# Patient Record
Sex: Female | Born: 1935
Health system: Southern US, Community
[De-identification: ages and names within clinical notes are randomized; demographics above are authoritative.]

## PROBLEM LIST (undated history)

## (undated) DIAGNOSIS — I77810 Thoracic aortic ectasia: Secondary | ICD-10-CM

## (undated) DIAGNOSIS — M8580 Other specified disorders of bone density and structure, unspecified site: Secondary | ICD-10-CM

## (undated) DIAGNOSIS — F5104 Psychophysiologic insomnia: Secondary | ICD-10-CM

## (undated) DIAGNOSIS — N390 Urinary tract infection, site not specified: Secondary | ICD-10-CM

## (undated) DIAGNOSIS — E7800 Pure hypercholesterolemia, unspecified: Secondary | ICD-10-CM

## (undated) DIAGNOSIS — K219 Gastro-esophageal reflux disease without esophagitis: Secondary | ICD-10-CM

## (undated) DIAGNOSIS — I700 Atherosclerosis of aorta: Secondary | ICD-10-CM

## (undated) DIAGNOSIS — R413 Other amnesia: Secondary | ICD-10-CM

## (undated) DIAGNOSIS — I771 Stricture of artery: Secondary | ICD-10-CM

## (undated) DIAGNOSIS — G2581 Restless legs syndrome: Secondary | ICD-10-CM

## (undated) DIAGNOSIS — Z6830 Body mass index (BMI) 30.0-30.9, adult: Secondary | ICD-10-CM

## (undated) DIAGNOSIS — H409 Unspecified glaucoma: Secondary | ICD-10-CM

## (undated) DIAGNOSIS — E1140 Type 2 diabetes mellitus with diabetic neuropathy, unspecified: Secondary | ICD-10-CM

## (undated) DIAGNOSIS — E0590 Thyrotoxicosis, unspecified without thyrotoxic crisis or storm: Secondary | ICD-10-CM

## (undated) DIAGNOSIS — M810 Age-related osteoporosis without current pathological fracture: Secondary | ICD-10-CM

## (undated) DIAGNOSIS — G629 Polyneuropathy, unspecified: Secondary | ICD-10-CM

## (undated) DIAGNOSIS — I10 Essential (primary) hypertension: Secondary | ICD-10-CM

## (undated) DIAGNOSIS — E119 Type 2 diabetes mellitus without complications: Secondary | ICD-10-CM

## (undated) HISTORY — DX: Other specified disorders of bone density and structure, unspecified site: M85.80

## (undated) HISTORY — DX: Gastro-esophageal reflux disease without esophagitis: K21.9

## (undated) HISTORY — DX: Body mass index (BMI) 30.0-30.9, adult: Z68.30

## (undated) HISTORY — DX: Stricture of artery: I77.1

## (undated) HISTORY — PX: ABDOMINAL HYSTERECTOMY: SHX81

## (undated) HISTORY — DX: Type 2 diabetes mellitus without complications: E11.9

## (undated) HISTORY — DX: Thyrotoxicosis, unspecified without thyrotoxic crisis or storm: E05.90

## (undated) HISTORY — DX: Type 2 diabetes mellitus with diabetic neuropathy, unspecified: E11.40

## (undated) HISTORY — DX: Unspecified glaucoma: H40.9

## (undated) HISTORY — DX: Polyneuropathy, unspecified: G62.9

## (undated) HISTORY — PX: KNEE SURGERY: SHX244

## (undated) HISTORY — DX: Pure hypercholesterolemia, unspecified: E78.00

## (undated) HISTORY — PX: BLADDER SUSPENSION: SHX72

## (undated) HISTORY — DX: Restless legs syndrome: G25.81

## (undated) HISTORY — DX: Urinary tract infection, site not specified: N39.0

## (undated) HISTORY — DX: Atherosclerosis of aorta: I70.0

## (undated) HISTORY — DX: Other amnesia: R41.3

## (undated) HISTORY — DX: Thoracic aortic ectasia: I77.810

## (undated) HISTORY — PX: CYST REMOVAL TRUNK: SHX6283

## (undated) HISTORY — DX: Psychophysiologic insomnia: F51.04

---

## 2013-09-25 DIAGNOSIS — M1710 Unilateral primary osteoarthritis, unspecified knee: Secondary | ICD-10-CM | POA: Insufficient documentation

## 2013-09-25 DIAGNOSIS — M179 Osteoarthritis of knee, unspecified: Secondary | ICD-10-CM | POA: Insufficient documentation

## 2013-09-25 DIAGNOSIS — E785 Hyperlipidemia, unspecified: Secondary | ICD-10-CM | POA: Insufficient documentation

## 2013-09-25 DIAGNOSIS — I10 Essential (primary) hypertension: Secondary | ICD-10-CM | POA: Insufficient documentation

## 2014-05-30 DIAGNOSIS — M179 Osteoarthritis of knee, unspecified: Secondary | ICD-10-CM | POA: Diagnosis not present

## 2014-05-30 DIAGNOSIS — G8918 Other acute postprocedural pain: Secondary | ICD-10-CM | POA: Diagnosis not present

## 2014-07-31 DIAGNOSIS — Z96651 Presence of right artificial knee joint: Secondary | ICD-10-CM | POA: Diagnosis not present

## 2014-08-02 DIAGNOSIS — Z96651 Presence of right artificial knee joint: Secondary | ICD-10-CM | POA: Diagnosis not present

## 2014-08-07 DIAGNOSIS — Z96651 Presence of right artificial knee joint: Secondary | ICD-10-CM | POA: Diagnosis not present

## 2014-08-09 DIAGNOSIS — Z96651 Presence of right artificial knee joint: Secondary | ICD-10-CM | POA: Diagnosis not present

## 2014-08-14 DIAGNOSIS — Z96651 Presence of right artificial knee joint: Secondary | ICD-10-CM | POA: Diagnosis not present

## 2014-08-16 DIAGNOSIS — Z96651 Presence of right artificial knee joint: Secondary | ICD-10-CM | POA: Diagnosis not present

## 2014-08-21 DIAGNOSIS — Z96651 Presence of right artificial knee joint: Secondary | ICD-10-CM | POA: Diagnosis not present

## 2014-08-23 DIAGNOSIS — Z96651 Presence of right artificial knee joint: Secondary | ICD-10-CM | POA: Diagnosis not present

## 2014-08-28 DIAGNOSIS — Z96651 Presence of right artificial knee joint: Secondary | ICD-10-CM | POA: Diagnosis not present

## 2014-08-30 DIAGNOSIS — Z96651 Presence of right artificial knee joint: Secondary | ICD-10-CM | POA: Diagnosis not present

## 2014-09-04 DIAGNOSIS — Z96651 Presence of right artificial knee joint: Secondary | ICD-10-CM | POA: Diagnosis not present

## 2014-09-05 DIAGNOSIS — I10 Essential (primary) hypertension: Secondary | ICD-10-CM | POA: Diagnosis not present

## 2014-09-06 DIAGNOSIS — Z96651 Presence of right artificial knee joint: Secondary | ICD-10-CM | POA: Diagnosis not present

## 2014-09-11 DIAGNOSIS — Z96651 Presence of right artificial knee joint: Secondary | ICD-10-CM | POA: Diagnosis not present

## 2014-09-13 DIAGNOSIS — Z96651 Presence of right artificial knee joint: Secondary | ICD-10-CM | POA: Diagnosis not present

## 2014-09-14 DIAGNOSIS — D125 Benign neoplasm of sigmoid colon: Secondary | ICD-10-CM | POA: Diagnosis not present

## 2014-09-14 DIAGNOSIS — K635 Polyp of colon: Secondary | ICD-10-CM | POA: Diagnosis not present

## 2014-09-14 DIAGNOSIS — K621 Rectal polyp: Secondary | ICD-10-CM | POA: Diagnosis not present

## 2014-09-14 DIAGNOSIS — Z1211 Encounter for screening for malignant neoplasm of colon: Secondary | ICD-10-CM | POA: Diagnosis not present

## 2014-09-14 DIAGNOSIS — K648 Other hemorrhoids: Secondary | ICD-10-CM | POA: Diagnosis not present

## 2014-09-14 DIAGNOSIS — Z5181 Encounter for therapeutic drug level monitoring: Secondary | ICD-10-CM | POA: Diagnosis not present

## 2014-09-14 DIAGNOSIS — T7840XA Allergy, unspecified, initial encounter: Secondary | ICD-10-CM | POA: Diagnosis not present

## 2014-09-17 DIAGNOSIS — M7541 Impingement syndrome of right shoulder: Secondary | ICD-10-CM | POA: Diagnosis not present

## 2014-09-18 DIAGNOSIS — Z96651 Presence of right artificial knee joint: Secondary | ICD-10-CM | POA: Diagnosis not present

## 2014-09-20 DIAGNOSIS — Z96651 Presence of right artificial knee joint: Secondary | ICD-10-CM | POA: Diagnosis not present

## 2014-09-21 DIAGNOSIS — M7541 Impingement syndrome of right shoulder: Secondary | ICD-10-CM | POA: Diagnosis not present

## 2014-09-24 DIAGNOSIS — E1169 Type 2 diabetes mellitus with other specified complication: Secondary | ICD-10-CM | POA: Diagnosis not present

## 2014-09-24 DIAGNOSIS — I10 Essential (primary) hypertension: Secondary | ICD-10-CM | POA: Diagnosis not present

## 2014-09-24 DIAGNOSIS — R0609 Other forms of dyspnea: Secondary | ICD-10-CM | POA: Diagnosis not present

## 2014-09-24 DIAGNOSIS — I5030 Unspecified diastolic (congestive) heart failure: Secondary | ICD-10-CM | POA: Diagnosis not present

## 2014-09-25 DIAGNOSIS — Z96651 Presence of right artificial knee joint: Secondary | ICD-10-CM | POA: Diagnosis not present

## 2014-09-26 DIAGNOSIS — M7541 Impingement syndrome of right shoulder: Secondary | ICD-10-CM | POA: Diagnosis not present

## 2014-09-27 DIAGNOSIS — Z96651 Presence of right artificial knee joint: Secondary | ICD-10-CM | POA: Diagnosis not present

## 2014-10-01 DIAGNOSIS — M7541 Impingement syndrome of right shoulder: Secondary | ICD-10-CM | POA: Diagnosis not present

## 2014-10-04 DIAGNOSIS — M7541 Impingement syndrome of right shoulder: Secondary | ICD-10-CM | POA: Diagnosis not present

## 2014-10-09 DIAGNOSIS — M7541 Impingement syndrome of right shoulder: Secondary | ICD-10-CM | POA: Diagnosis not present

## 2014-10-11 DIAGNOSIS — M25661 Stiffness of right knee, not elsewhere classified: Secondary | ICD-10-CM | POA: Diagnosis not present

## 2014-10-11 DIAGNOSIS — M25561 Pain in right knee: Secondary | ICD-10-CM | POA: Diagnosis not present

## 2014-10-15 DIAGNOSIS — M7541 Impingement syndrome of right shoulder: Secondary | ICD-10-CM | POA: Diagnosis not present

## 2014-10-17 DIAGNOSIS — M7541 Impingement syndrome of right shoulder: Secondary | ICD-10-CM | POA: Diagnosis not present

## 2014-10-22 DIAGNOSIS — I5030 Unspecified diastolic (congestive) heart failure: Secondary | ICD-10-CM | POA: Diagnosis not present

## 2014-10-22 DIAGNOSIS — R300 Dysuria: Secondary | ICD-10-CM | POA: Diagnosis not present

## 2014-10-22 DIAGNOSIS — E1169 Type 2 diabetes mellitus with other specified complication: Secondary | ICD-10-CM | POA: Diagnosis not present

## 2014-11-25 DIAGNOSIS — R21 Rash and other nonspecific skin eruption: Secondary | ICD-10-CM | POA: Diagnosis not present

## 2014-12-01 DIAGNOSIS — G4700 Insomnia, unspecified: Secondary | ICD-10-CM | POA: Diagnosis not present

## 2014-12-01 DIAGNOSIS — M5489 Other dorsalgia: Secondary | ICD-10-CM | POA: Diagnosis not present

## 2014-12-01 DIAGNOSIS — N3000 Acute cystitis without hematuria: Secondary | ICD-10-CM | POA: Diagnosis not present

## 2014-12-21 DIAGNOSIS — I10 Essential (primary) hypertension: Secondary | ICD-10-CM | POA: Diagnosis not present

## 2014-12-21 DIAGNOSIS — L609 Nail disorder, unspecified: Secondary | ICD-10-CM | POA: Diagnosis not present

## 2014-12-21 DIAGNOSIS — N390 Urinary tract infection, site not specified: Secondary | ICD-10-CM | POA: Diagnosis not present

## 2015-01-14 DIAGNOSIS — I5030 Unspecified diastolic (congestive) heart failure: Secondary | ICD-10-CM | POA: Diagnosis not present

## 2015-01-14 DIAGNOSIS — N390 Urinary tract infection, site not specified: Secondary | ICD-10-CM | POA: Diagnosis not present

## 2015-01-14 DIAGNOSIS — E1149 Type 2 diabetes mellitus with other diabetic neurological complication: Secondary | ICD-10-CM | POA: Diagnosis not present

## 2015-01-14 DIAGNOSIS — E1169 Type 2 diabetes mellitus with other specified complication: Secondary | ICD-10-CM | POA: Diagnosis not present

## 2015-02-01 DIAGNOSIS — N390 Urinary tract infection, site not specified: Secondary | ICD-10-CM | POA: Diagnosis not present

## 2015-02-03 DIAGNOSIS — M545 Low back pain: Secondary | ICD-10-CM | POA: Diagnosis not present

## 2015-02-06 DIAGNOSIS — K760 Fatty (change of) liver, not elsewhere classified: Secondary | ICD-10-CM | POA: Diagnosis not present

## 2015-03-15 DIAGNOSIS — N390 Urinary tract infection, site not specified: Secondary | ICD-10-CM | POA: Diagnosis not present

## 2015-03-21 DIAGNOSIS — E1169 Type 2 diabetes mellitus with other specified complication: Secondary | ICD-10-CM | POA: Diagnosis not present

## 2015-03-21 DIAGNOSIS — E1149 Type 2 diabetes mellitus with other diabetic neurological complication: Secondary | ICD-10-CM | POA: Diagnosis not present

## 2015-03-21 DIAGNOSIS — N390 Urinary tract infection, site not specified: Secondary | ICD-10-CM | POA: Diagnosis not present

## 2015-03-21 DIAGNOSIS — Z23 Encounter for immunization: Secondary | ICD-10-CM | POA: Diagnosis not present

## 2015-04-12 DIAGNOSIS — N390 Urinary tract infection, site not specified: Secondary | ICD-10-CM | POA: Diagnosis not present

## 2015-04-12 DIAGNOSIS — N8110 Cystocele, unspecified: Secondary | ICD-10-CM | POA: Diagnosis not present

## 2015-06-05 DIAGNOSIS — Z0000 Encounter for general adult medical examination without abnormal findings: Secondary | ICD-10-CM | POA: Diagnosis not present

## 2015-06-05 DIAGNOSIS — R12 Heartburn: Secondary | ICD-10-CM | POA: Diagnosis not present

## 2015-06-05 DIAGNOSIS — E1149 Type 2 diabetes mellitus with other diabetic neurological complication: Secondary | ICD-10-CM | POA: Diagnosis not present

## 2015-06-05 DIAGNOSIS — E1169 Type 2 diabetes mellitus with other specified complication: Secondary | ICD-10-CM | POA: Diagnosis not present

## 2015-06-05 DIAGNOSIS — I5030 Unspecified diastolic (congestive) heart failure: Secondary | ICD-10-CM | POA: Diagnosis not present

## 2015-07-08 DIAGNOSIS — N611 Abscess of the breast and nipple: Secondary | ICD-10-CM | POA: Diagnosis not present

## 2015-07-11 DIAGNOSIS — N611 Abscess of the breast and nipple: Secondary | ICD-10-CM | POA: Diagnosis not present

## 2015-07-30 DIAGNOSIS — L720 Epidermal cyst: Secondary | ICD-10-CM | POA: Diagnosis not present

## 2015-09-25 DIAGNOSIS — Z6830 Body mass index (BMI) 30.0-30.9, adult: Secondary | ICD-10-CM | POA: Diagnosis not present

## 2015-09-25 DIAGNOSIS — Z0000 Encounter for general adult medical examination without abnormal findings: Secondary | ICD-10-CM | POA: Diagnosis not present

## 2015-09-25 DIAGNOSIS — Z0189 Encounter for other specified special examinations: Secondary | ICD-10-CM | POA: Diagnosis not present

## 2015-09-25 DIAGNOSIS — E1169 Type 2 diabetes mellitus with other specified complication: Secondary | ICD-10-CM | POA: Diagnosis not present

## 2015-09-25 DIAGNOSIS — E1149 Type 2 diabetes mellitus with other diabetic neurological complication: Secondary | ICD-10-CM | POA: Diagnosis not present

## 2015-09-25 DIAGNOSIS — R8290 Unspecified abnormal findings in urine: Secondary | ICD-10-CM | POA: Diagnosis not present

## 2015-09-25 DIAGNOSIS — I5030 Unspecified diastolic (congestive) heart failure: Secondary | ICD-10-CM | POA: Diagnosis not present

## 2015-09-25 DIAGNOSIS — I119 Hypertensive heart disease without heart failure: Secondary | ICD-10-CM | POA: Diagnosis not present

## 2015-09-26 DIAGNOSIS — Z6830 Body mass index (BMI) 30.0-30.9, adult: Secondary | ICD-10-CM | POA: Diagnosis not present

## 2015-09-26 DIAGNOSIS — I119 Hypertensive heart disease without heart failure: Secondary | ICD-10-CM | POA: Diagnosis not present

## 2015-09-26 DIAGNOSIS — R0609 Other forms of dyspnea: Secondary | ICD-10-CM | POA: Diagnosis not present

## 2015-09-30 DIAGNOSIS — M1712 Unilateral primary osteoarthritis, left knee: Secondary | ICD-10-CM | POA: Diagnosis not present

## 2015-10-02 DIAGNOSIS — E559 Vitamin D deficiency, unspecified: Secondary | ICD-10-CM | POA: Diagnosis not present

## 2015-10-02 DIAGNOSIS — Z1389 Encounter for screening for other disorder: Secondary | ICD-10-CM | POA: Diagnosis not present

## 2015-10-02 DIAGNOSIS — N390 Urinary tract infection, site not specified: Secondary | ICD-10-CM | POA: Diagnosis not present

## 2015-10-02 DIAGNOSIS — E1169 Type 2 diabetes mellitus with other specified complication: Secondary | ICD-10-CM | POA: Diagnosis not present

## 2015-10-02 DIAGNOSIS — I5030 Unspecified diastolic (congestive) heart failure: Secondary | ICD-10-CM | POA: Diagnosis not present

## 2015-10-02 DIAGNOSIS — Z23 Encounter for immunization: Secondary | ICD-10-CM | POA: Diagnosis not present

## 2015-10-03 DIAGNOSIS — M899 Disorder of bone, unspecified: Secondary | ICD-10-CM | POA: Diagnosis not present

## 2015-10-03 DIAGNOSIS — Z803 Family history of malignant neoplasm of breast: Secondary | ICD-10-CM | POA: Diagnosis not present

## 2015-10-03 DIAGNOSIS — Z780 Asymptomatic menopausal state: Secondary | ICD-10-CM | POA: Diagnosis not present

## 2015-10-03 DIAGNOSIS — Z1231 Encounter for screening mammogram for malignant neoplasm of breast: Secondary | ICD-10-CM | POA: Diagnosis not present

## 2015-11-01 DIAGNOSIS — Z23 Encounter for immunization: Secondary | ICD-10-CM | POA: Diagnosis not present

## 2015-11-01 DIAGNOSIS — E1169 Type 2 diabetes mellitus with other specified complication: Secondary | ICD-10-CM | POA: Diagnosis not present

## 2015-11-01 DIAGNOSIS — I5030 Unspecified diastolic (congestive) heart failure: Secondary | ICD-10-CM | POA: Diagnosis not present

## 2015-11-01 DIAGNOSIS — N390 Urinary tract infection, site not specified: Secondary | ICD-10-CM | POA: Diagnosis not present

## 2015-11-01 DIAGNOSIS — M810 Age-related osteoporosis without current pathological fracture: Secondary | ICD-10-CM | POA: Diagnosis not present

## 2015-12-30 DIAGNOSIS — R21 Rash and other nonspecific skin eruption: Secondary | ICD-10-CM | POA: Diagnosis not present

## 2015-12-30 DIAGNOSIS — N390 Urinary tract infection, site not specified: Secondary | ICD-10-CM | POA: Diagnosis not present

## 2016-01-07 DIAGNOSIS — N3020 Other chronic cystitis without hematuria: Secondary | ICD-10-CM | POA: Diagnosis not present

## 2016-01-07 DIAGNOSIS — N952 Postmenopausal atrophic vaginitis: Secondary | ICD-10-CM | POA: Diagnosis not present

## 2016-02-10 DIAGNOSIS — K449 Diaphragmatic hernia without obstruction or gangrene: Secondary | ICD-10-CM | POA: Diagnosis not present

## 2016-02-10 DIAGNOSIS — N281 Cyst of kidney, acquired: Secondary | ICD-10-CM | POA: Diagnosis not present

## 2016-02-24 DIAGNOSIS — N281 Cyst of kidney, acquired: Secondary | ICD-10-CM | POA: Diagnosis not present

## 2016-02-24 DIAGNOSIS — D3502 Benign neoplasm of left adrenal gland: Secondary | ICD-10-CM | POA: Diagnosis not present

## 2016-02-24 DIAGNOSIS — N3020 Other chronic cystitis without hematuria: Secondary | ICD-10-CM | POA: Diagnosis not present

## 2016-02-24 DIAGNOSIS — N952 Postmenopausal atrophic vaginitis: Secondary | ICD-10-CM | POA: Diagnosis not present

## 2016-03-31 DIAGNOSIS — E1169 Type 2 diabetes mellitus with other specified complication: Secondary | ICD-10-CM | POA: Diagnosis not present

## 2016-04-02 DIAGNOSIS — I119 Hypertensive heart disease without heart failure: Secondary | ICD-10-CM | POA: Diagnosis not present

## 2016-04-02 DIAGNOSIS — R5383 Other fatigue: Secondary | ICD-10-CM | POA: Diagnosis not present

## 2016-04-02 DIAGNOSIS — R0609 Other forms of dyspnea: Secondary | ICD-10-CM | POA: Diagnosis not present

## 2016-04-14 DIAGNOSIS — I119 Hypertensive heart disease without heart failure: Secondary | ICD-10-CM | POA: Diagnosis not present

## 2016-04-14 DIAGNOSIS — E1169 Type 2 diabetes mellitus with other specified complication: Secondary | ICD-10-CM | POA: Diagnosis not present

## 2016-04-14 DIAGNOSIS — F329 Major depressive disorder, single episode, unspecified: Secondary | ICD-10-CM | POA: Diagnosis not present

## 2016-08-14 DIAGNOSIS — M25561 Pain in right knee: Secondary | ICD-10-CM | POA: Diagnosis not present

## 2016-08-14 DIAGNOSIS — I5030 Unspecified diastolic (congestive) heart failure: Secondary | ICD-10-CM | POA: Diagnosis not present

## 2016-08-14 DIAGNOSIS — R8290 Unspecified abnormal findings in urine: Secondary | ICD-10-CM | POA: Diagnosis not present

## 2016-08-14 DIAGNOSIS — I119 Hypertensive heart disease without heart failure: Secondary | ICD-10-CM | POA: Diagnosis not present

## 2016-08-14 DIAGNOSIS — E1149 Type 2 diabetes mellitus with other diabetic neurological complication: Secondary | ICD-10-CM | POA: Diagnosis not present

## 2016-08-14 DIAGNOSIS — F329 Major depressive disorder, single episode, unspecified: Secondary | ICD-10-CM | POA: Diagnosis not present

## 2016-08-14 DIAGNOSIS — M25562 Pain in left knee: Secondary | ICD-10-CM | POA: Diagnosis not present

## 2016-08-14 DIAGNOSIS — E1169 Type 2 diabetes mellitus with other specified complication: Secondary | ICD-10-CM | POA: Diagnosis not present

## 2016-08-14 DIAGNOSIS — Z1389 Encounter for screening for other disorder: Secondary | ICD-10-CM | POA: Diagnosis not present

## 2016-08-31 DIAGNOSIS — R8271 Bacteriuria: Secondary | ICD-10-CM | POA: Diagnosis not present

## 2016-08-31 DIAGNOSIS — N281 Cyst of kidney, acquired: Secondary | ICD-10-CM | POA: Diagnosis not present

## 2016-08-31 DIAGNOSIS — N952 Postmenopausal atrophic vaginitis: Secondary | ICD-10-CM | POA: Diagnosis not present

## 2016-08-31 DIAGNOSIS — D3502 Benign neoplasm of left adrenal gland: Secondary | ICD-10-CM | POA: Diagnosis not present

## 2016-08-31 DIAGNOSIS — N3020 Other chronic cystitis without hematuria: Secondary | ICD-10-CM | POA: Diagnosis not present

## 2016-10-12 DIAGNOSIS — Z1231 Encounter for screening mammogram for malignant neoplasm of breast: Secondary | ICD-10-CM | POA: Diagnosis not present

## 2016-10-12 DIAGNOSIS — N952 Postmenopausal atrophic vaginitis: Secondary | ICD-10-CM | POA: Diagnosis not present

## 2016-10-12 DIAGNOSIS — N760 Acute vaginitis: Secondary | ICD-10-CM | POA: Diagnosis not present

## 2016-10-12 DIAGNOSIS — N898 Other specified noninflammatory disorders of vagina: Secondary | ICD-10-CM | POA: Diagnosis not present

## 2016-10-12 DIAGNOSIS — Z124 Encounter for screening for malignant neoplasm of cervix: Secondary | ICD-10-CM | POA: Diagnosis not present

## 2016-10-12 DIAGNOSIS — N8111 Cystocele, midline: Secondary | ICD-10-CM | POA: Diagnosis not present

## 2016-10-12 DIAGNOSIS — Z01419 Encounter for gynecological examination (general) (routine) without abnormal findings: Secondary | ICD-10-CM | POA: Diagnosis not present

## 2016-10-27 DIAGNOSIS — N8111 Cystocele, midline: Secondary | ICD-10-CM | POA: Diagnosis not present

## 2016-11-24 DIAGNOSIS — H02834 Dermatochalasis of left upper eyelid: Secondary | ICD-10-CM | POA: Diagnosis not present

## 2016-11-24 DIAGNOSIS — H5202 Hypermetropia, left eye: Secondary | ICD-10-CM | POA: Diagnosis not present

## 2016-11-24 DIAGNOSIS — H524 Presbyopia: Secondary | ICD-10-CM | POA: Diagnosis not present

## 2016-11-24 DIAGNOSIS — H2513 Age-related nuclear cataract, bilateral: Secondary | ICD-10-CM | POA: Diagnosis not present

## 2016-11-24 DIAGNOSIS — H02831 Dermatochalasis of right upper eyelid: Secondary | ICD-10-CM | POA: Diagnosis not present

## 2016-11-24 DIAGNOSIS — H02832 Dermatochalasis of right lower eyelid: Secondary | ICD-10-CM | POA: Diagnosis not present

## 2016-11-30 DIAGNOSIS — R399 Unspecified symptoms and signs involving the genitourinary system: Secondary | ICD-10-CM | POA: Diagnosis not present

## 2016-11-30 DIAGNOSIS — I10 Essential (primary) hypertension: Secondary | ICD-10-CM | POA: Diagnosis not present

## 2016-11-30 DIAGNOSIS — E119 Type 2 diabetes mellitus without complications: Secondary | ICD-10-CM | POA: Diagnosis not present

## 2016-11-30 DIAGNOSIS — Z6830 Body mass index (BMI) 30.0-30.9, adult: Secondary | ICD-10-CM | POA: Diagnosis not present

## 2016-12-04 ENCOUNTER — Emergency Department (HOSPITAL_COMMUNITY): Payer: Medicare Other

## 2016-12-04 ENCOUNTER — Emergency Department (HOSPITAL_COMMUNITY)
Admission: EM | Admit: 2016-12-04 | Discharge: 2016-12-04 | Disposition: A | Discharge status: Home / Self Care | Payer: Medicare Other | Attending: Emergency Medicine | Admitting: Emergency Medicine

## 2016-12-04 ENCOUNTER — Encounter (HOSPITAL_COMMUNITY): Payer: Self-pay

## 2016-12-04 DIAGNOSIS — Y939 Activity, unspecified: Secondary | ICD-10-CM | POA: Insufficient documentation

## 2016-12-04 DIAGNOSIS — W1789XA Other fall from one level to another, initial encounter: Secondary | ICD-10-CM | POA: Insufficient documentation

## 2016-12-04 DIAGNOSIS — I10 Essential (primary) hypertension: Secondary | ICD-10-CM | POA: Diagnosis not present

## 2016-12-04 DIAGNOSIS — Y929 Unspecified place or not applicable: Secondary | ICD-10-CM | POA: Insufficient documentation

## 2016-12-04 DIAGNOSIS — M533 Sacrococcygeal disorders, not elsewhere classified: Secondary | ICD-10-CM | POA: Diagnosis not present

## 2016-12-04 DIAGNOSIS — Z7982 Long term (current) use of aspirin: Secondary | ICD-10-CM | POA: Diagnosis not present

## 2016-12-04 DIAGNOSIS — Z79899 Other long term (current) drug therapy: Secondary | ICD-10-CM | POA: Insufficient documentation

## 2016-12-04 DIAGNOSIS — S300XXA Contusion of lower back and pelvis, initial encounter: Secondary | ICD-10-CM | POA: Insufficient documentation

## 2016-12-04 DIAGNOSIS — Y999 Unspecified external cause status: Secondary | ICD-10-CM | POA: Diagnosis not present

## 2016-12-04 DIAGNOSIS — S3992XA Unspecified injury of lower back, initial encounter: Secondary | ICD-10-CM | POA: Diagnosis present

## 2016-12-04 HISTORY — DX: Age-related osteoporosis without current pathological fracture: M81.0

## 2016-12-04 HISTORY — DX: Essential (primary) hypertension: I10

## 2016-12-04 NOTE — ED Triage Notes (Addendum)
Pt fell x 2 days ago.  Landed on cement via buttocks.  Difficult to sit.  No head trauma.  Pt mechanical fall

## 2016-12-04 NOTE — ED Provider Notes (Signed)
[facility] DEPT Provider Note   CSN: [id number] Arrival date & time: 12/04/16  1519     History   Chief Complaint Chief Complaint  Patient presents with  . Fall  . Back Pain    HPI Karen Blankenship is a 83 y.o. female.  HPI   83 year old female presents with concern for mechanical fall 2 days ago. Daughter reports that her mother was helping clean a cabana, and she a [employer] nest fell down, she became alarmed when he was a bat, and fell backwards. Reports she landed on her back. She is not sure she had her head because it happened so fast. Denies headache. Reports right lateral neck pain, but no midline pain. Reports 9 out of 10 pain in her tailbone. Reports she fell so hard " it felt like my butt exploded" per daughter's translation. Denies any problems with bowel movements or going to the bathroom. Denies numbness or weakness. Denies other back pain. Reports the pain in her tailbone is worse with sitting, bending, moving.  Ibuprofen helps with pain but when it wears off pain is severe. Not on anticoagulation.  Past Medical History:  Diagnosis Date  . Hypertension   . Osteoporosis     There are no active problems to display for this patient.   Past Surgical History:  Procedure Laterality Date  . ABDOMINAL HYSTERECTOMY    . BLADDER SUSPENSION    . CYST REMOVAL TRUNK    . KNEE SURGERY      OB History    No data available       Home Medications    Prior to Admission medications   Medication Sig Start Date End Date Taking? Authorizing Provider  alendronate (FOSAMAX) 70 MG tablet Take 70 mg by mouth once a week. Take with a full glass of water on an empty stomach.   Yes [provider]  aspirin 81 MG chewable tablet Chew 81 mg by mouth daily.   Yes [provider]  calcium acetate (PHOSLO) 667 MG capsule Take 667 mg by mouth 3 (three) times daily with meals.   Yes [provider]  Cholecalciferol (D3-1000 PO) Take 2,000 mg by mouth daily.   Yes  [provider]  CRANBERRY CONCENTRATE PO Take 1 capsule by mouth daily.   Yes [provider]  Cyanocobalamin (VITAMIN B 12 PO) Take 1 tablet by mouth daily.   Yes [provider]  gabapentin (NEURONTIN) 100 MG capsule Take 100 mg by mouth 2 (two) times daily.   Yes [provider]  hydrochlorothiazide (MICROZIDE) 12.5 MG capsule Take 12.5 mg by mouth daily.   Yes [provider]  ibuprofen (ADVIL,MOTRIN) 600 MG tablet Take 600 mg by mouth every 6 (six) hours as needed for pain.   Yes [provider]  lisinopril (PRINIVIL,ZESTRIL) 20 MG tablet Take 20 mg by mouth daily.   Yes [provider]    Family History History reviewed. No pertinent family history.  Social History Social History  Substance Use Topics  . Smoking status: Never Smoker  . Smokeless tobacco: Never Used  . Alcohol use No     Allergies   Quinine derivatives; Tramadol; and Sulfa antibiotics   Review of Systems Review of Systems  Constitutional: Negative for fever.  HENT: Negative for sore throat.   Eyes: Negative for visual disturbance.  Respiratory: Negative for cough and shortness of breath.   Cardiovascular: Negative for chest pain.  Gastrointestinal: Negative for abdominal pain.  Genitourinary: Negative for difficulty  urinating.  Musculoskeletal: Positive for arthralgias and neck pain (right lateral). Negative for back pain.  Skin: Negative for rash.  Neurological: Negative for syncope, weakness, numbness and headaches.     Physical Exam Updated Vital Signs BP (!) 126/49 (BP Location: Left Arm)   Pulse (!) 59   Temp 98.4 F (36.9 C) (Oral)   Resp 13   SpO2 95%   Physical Exam  Constitutional: She is oriented to person, place, and time. She appears well-developed and well-nourished. No distress.  HENT:  Head: Normocephalic and atraumatic.  Eyes: Conjunctivae and EOM are normal.  Neck: Normal range of motion.  Cardiovascular:  Normal rate, regular rhythm and intact distal pulses.   Pulmonary/Chest: Effort normal. No respiratory distress.  Musculoskeletal: She exhibits no edema or tenderness.       Lumbar back: She exhibits bony tenderness (coccyx, no lumbar tenderness).  Neurological: She is alert and oriented to person, place, and time. She has normal strength. No sensory deficit.  Skin: Skin is warm and dry. No rash noted. She is not diaphoretic. No erythema.  Nursing note and vitals reviewed.    ED Treatments / Results  Labs (all labs ordered are listed, but only abnormal results are displayed) Labs Reviewed - No data to display  EKG  EKG Interpretation None       Radiology Dg Sacrum/coccyx  Result Date: 12/04/2016 CLINICAL DATA:  Continued coccyx pain after fall times a few days ago. EXAM: SACRUM AND COCCYX - 2+ VIEW COMPARISON:  None. FINDINGS: The sacroiliac joints and pubic symphysis are maintained. The sacrum and coccyx are aligned without apparent fracture radiographically. The arcuate lines the sacrum appear intact. The visualized included hips are maintained within their respective hip joints bilaterally. Sutures project over the mid pelvis. IMPRESSION: No radiographic evidence of fracture. Electronically Signed   By: Ashley Royalty M.D.   On: 12/04/2016 17:34    Procedures Procedures (including critical care time)  Medications Ordered in ED Medications - No data to display   Initial Impression / Assessment and Plan / ED Course  I have reviewed the triage vital signs and the nursing notes.  Pertinent labs & imaging results that were available during my care of the patient were reviewed by me and considered in my medical decision making (see chart for details).     83 year old female with a history of hypertension presents with concern for mechanical fall 2 days ago with tailbone pain.  Patient is not on blood thinners, denies headache, nausea or vomiting. She has no numbness or weakness. No  midline cervical spine tenderness, and has muscular tenderness over the right side of her neck, which is muscular strain. X-ray of the sacrum and coccyx shows no acute findings. Tenderness is localized to this area and doubt other occult pelvic fx.  Recommend supportive care, NSAIDS, ice.  Patient discharged in stable condition with understanding of reasons to return.    Final Clinical Impressions(s) / ED Diagnoses   Final diagnoses:  Coccyx contusion, initial encounter    New Prescriptions Discharge Medication List as of 12/04/2016  6:10 PM       Gareth Morgan, MD 12/05/16 1339

## 2016-12-31 DIAGNOSIS — E1169 Type 2 diabetes mellitus with other specified complication: Secondary | ICD-10-CM | POA: Diagnosis not present

## 2016-12-31 DIAGNOSIS — I119 Hypertensive heart disease without heart failure: Secondary | ICD-10-CM | POA: Diagnosis not present

## 2017-02-10 DIAGNOSIS — N819 Female genital prolapse, unspecified: Secondary | ICD-10-CM | POA: Diagnosis not present

## 2017-02-10 DIAGNOSIS — N390 Urinary tract infection, site not specified: Secondary | ICD-10-CM | POA: Diagnosis not present

## 2017-03-30 DIAGNOSIS — H02835 Dermatochalasis of left lower eyelid: Secondary | ICD-10-CM | POA: Diagnosis not present

## 2017-03-30 DIAGNOSIS — H35373 Puckering of macula, bilateral: Secondary | ICD-10-CM | POA: Diagnosis not present

## 2017-03-30 DIAGNOSIS — H524 Presbyopia: Secondary | ICD-10-CM | POA: Diagnosis not present

## 2017-03-30 DIAGNOSIS — H25813 Combined forms of age-related cataract, bilateral: Secondary | ICD-10-CM | POA: Diagnosis not present

## 2017-03-30 DIAGNOSIS — H02832 Dermatochalasis of right lower eyelid: Secondary | ICD-10-CM | POA: Diagnosis not present

## 2017-03-30 DIAGNOSIS — H02831 Dermatochalasis of right upper eyelid: Secondary | ICD-10-CM | POA: Diagnosis not present

## 2017-03-30 DIAGNOSIS — H02834 Dermatochalasis of left upper eyelid: Secondary | ICD-10-CM | POA: Diagnosis not present

## 2017-04-19 DIAGNOSIS — H2511 Age-related nuclear cataract, right eye: Secondary | ICD-10-CM | POA: Diagnosis not present

## 2017-05-01 DIAGNOSIS — Z23 Encounter for immunization: Secondary | ICD-10-CM | POA: Diagnosis not present

## 2017-05-03 DIAGNOSIS — H2511 Age-related nuclear cataract, right eye: Secondary | ICD-10-CM | POA: Diagnosis not present

## 2017-05-03 DIAGNOSIS — H25811 Combined forms of age-related cataract, right eye: Secondary | ICD-10-CM | POA: Diagnosis not present

## 2017-05-10 DIAGNOSIS — M810 Age-related osteoporosis without current pathological fracture: Secondary | ICD-10-CM | POA: Diagnosis not present

## 2017-05-10 DIAGNOSIS — R918 Other nonspecific abnormal finding of lung field: Secondary | ICD-10-CM | POA: Diagnosis not present

## 2017-05-10 DIAGNOSIS — E119 Type 2 diabetes mellitus without complications: Secondary | ICD-10-CM | POA: Diagnosis not present

## 2017-05-10 DIAGNOSIS — K219 Gastro-esophageal reflux disease without esophagitis: Secondary | ICD-10-CM | POA: Diagnosis not present

## 2017-05-10 DIAGNOSIS — G2581 Restless legs syndrome: Secondary | ICD-10-CM | POA: Diagnosis not present

## 2017-05-10 DIAGNOSIS — I10 Essential (primary) hypertension: Secondary | ICD-10-CM | POA: Diagnosis not present

## 2017-05-11 DIAGNOSIS — H2512 Age-related nuclear cataract, left eye: Secondary | ICD-10-CM | POA: Diagnosis not present

## 2017-05-18 ENCOUNTER — Other Ambulatory Visit: Payer: Self-pay | Admitting: Family Medicine

## 2017-05-18 DIAGNOSIS — R918 Other nonspecific abnormal finding of lung field: Secondary | ICD-10-CM

## 2017-05-24 DIAGNOSIS — H25812 Combined forms of age-related cataract, left eye: Secondary | ICD-10-CM | POA: Diagnosis not present

## 2017-05-24 DIAGNOSIS — H2512 Age-related nuclear cataract, left eye: Secondary | ICD-10-CM | POA: Diagnosis not present

## 2017-06-04 ENCOUNTER — Ambulatory Visit
Admission: RE | Admit: 2017-06-04 | Discharge: 2017-06-04 | Disposition: A | Discharge status: Home / Self Care | Payer: Medicare Other | Source: Ambulatory Visit | Attending: Family Medicine | Admitting: Family Medicine

## 2017-06-04 DIAGNOSIS — R918 Other nonspecific abnormal finding of lung field: Secondary | ICD-10-CM | POA: Diagnosis not present

## 2017-06-24 NOTE — Telephone Encounter (Signed)
06/24/2017 Received faxed medical records from [place] at [facility] for upcoming appointment with Dr. Percival Spanish on 07/30/2017.  Records given to [facility]. cbr

## 2017-07-01 ENCOUNTER — Ambulatory Visit (INDEPENDENT_AMBULATORY_CARE_PROVIDER_SITE_OTHER): Payer: Medicare Other | Admitting: Vascular Surgery

## 2017-07-01 ENCOUNTER — Encounter: Payer: Self-pay | Admitting: Vascular Surgery

## 2017-07-01 VITALS — BP 139/80 | HR 71 | Temp 97.6°F | Resp 16 | Ht 62.0 in | Wt 165.0 lb

## 2017-07-01 DIAGNOSIS — I712 Thoracic aortic aneurysm, without rupture, unspecified: Secondary | ICD-10-CM

## 2017-07-01 DIAGNOSIS — I771 Stricture of artery: Secondary | ICD-10-CM | POA: Diagnosis not present

## 2017-07-01 NOTE — Progress Notes (Signed)
HISTORY AND PHYSICAL     CC:  Findings on CT scan Requesting Provider:  Maurice Small, MD  HPI: This is a 84 y.o. female who is here with her daughter to follow up on findings on recent CT scan.  (pt understands a little english, but her daughter is translating).  She states that she had a CT scan several years ago that revealed lung nodules.  She did follow up a year later, but then was lost due to some family issues.  The daughter states that she urged her mom to follow up on the nodules and she had the CT scan.  It revealed a mild dilatation of the thoracic aorta of 4cm and significant aortic arch branch vessel atherosclerotic disease particularly the left subclavian artery.  Due to these findings, the pt was referred to vascular surgery.   The pt is asymptomatic from these findings as she does not have any arm fatigue and has minimal dizziness.  She has not had any stroke symptoms.  She does have leg symptoms of some leg pain when lying flat, but feels better when walking.  She does not get any cramping when walking.   She has never smoked.  She does not drink alcohol.   She takes a daily aspirin.  She is on an ACEI and diuretic for blood pressure management.    Past Medical History:  Diagnosis Date  . Hypertension   . Osteoporosis     Past Surgical History:  Procedure Laterality Date  . ABDOMINAL HYSTERECTOMY    . BLADDER SUSPENSION    . CYST REMOVAL TRUNK    . KNEE SURGERY      Allergies  Allergen Reactions  . Quinine Derivatives Anaphylaxis  . Tramadol Itching  . Sulfa Antibiotics     Current Outpatient Medications  Medication Sig Dispense Refill  . alendronate (FOSAMAX) 70 MG tablet Take 70 mg by mouth once a week. Take with a full glass of water on an empty stomach.    Marland Kitchen aspirin 81 MG chewable tablet Chew 81 mg by mouth daily.    . calcium acetate (PHOSLO) 667 MG capsule Take 667 mg by mouth 3 (three) times daily with meals.    . Cholecalciferol (D3-1000 PO) Take  2,000 mg by mouth daily.    . Cyanocobalamin (VITAMIN B 12 PO) Take 1 tablet by mouth daily.    Marland Kitchen gabapentin (NEURONTIN) 100 MG capsule Take 100 mg by mouth 2 (two) times daily.    . hydrochlorothiazide (MICROZIDE) 12.5 MG capsule Take 12.5 mg by mouth daily.    Marland Kitchen ibuprofen (ADVIL,MOTRIN) 600 MG tablet Take 600 mg by mouth every 6 (six) hours as needed for pain.    Marland Kitchen lisinopril (PRINIVIL,ZESTRIL) 20 MG tablet Take 20 mg by mouth daily.    Marland Kitchen omeprazole (PRILOSEC) 20 MG capsule Take 20 mg by mouth daily.    Marland Kitchen CRANBERRY CONCENTRATE PO Take 1 capsule by mouth daily.     No current facility-administered medications for this visit.     Family History:  There is no family hx of heart disease before the age of 65 or AAA   Social History   Socioeconomic History  . Marital status: Widowed    Spouse name: Not on file  . Number of children: Not on file  . Years of education: Not on file  . Highest education level: Not on file  Social Needs  . Financial resource strain: Not on file  . Food insecurity - worry: Not  on file  . Food insecurity - inability: Not on file  . Transportation needs - medical: Not on file  . Transportation needs - non-medical: Not on file  Occupational History  . Not on file  Tobacco Use  . Smoking status: Never Smoker  . Smokeless tobacco: Never Used  Substance and Sexual Activity  . Alcohol use: No  . Drug use: No  . Sexual activity: Not on file  Other Topics Concern  . Not on file  Social History Narrative  . Not on file     REVIEW OF SYSTEMS:   [X]  denotes positive finding, [ ]  denotes negative finding Cardiac  Comments:  Chest pain or chest pressure:    Shortness of breath upon exertion:    Short of breath when lying flat:    Irregular heart rhythm:        Vascular    Pain in calf, thigh, or hip brought on by ambulation:    Pain in feet at night that wakes you up from your sleep:     Blood clot in your veins:    Leg swelling:  x Sometimes behind  the knee  Leg cramps x   Pulmonary    Oxygen at home:    Productive cough:     Wheezing:         Neurologic    Sudden weakness in arms or legs:     Sudden numbness in arms or legs:     Sudden onset of difficulty speaking or slurred speech:    Temporary loss of vision in one eye:     Problems with dizziness:         Gastrointestinal    Blood in stool:     Vomited blood:         Genitourinary    Burning when urinating:     Blood in urine:        Psychiatric    Major depression:         Hematologic    Bleeding problems:    Problems with blood clotting too easily:        Skin    Rashes or ulcers:        Constitutional    Fever or chills:      PHYSICAL EXAMINATION:  Vitals:   07/01/17 1234  BP: 139/80  Pulse: 71  Resp: 16  Temp: 97.6 F (36.4 C)  SpO2: 98%   Vitals:   07/01/17 1234  Weight: 165 lb (74.8 kg)  Height: 5\' 2"  (1.575 m)   Body mass index is 30.18 kg/m.  General:  WDWN in NAD; vital signs documented above Gait: Not observed HENT: WNL, normocephalic Pulmonary: normal non-labored breathing , without Rales, rhonchi,  wheezing Cardiac: regular HR, without  Murmurs without carotid bruits Abdomen: soft, NT, no masses Skin: without rashes Vascular Exam/Pulses:  Right Left  Radial 2+ (normal) 2+ (normal)  Ulnar 2+ (normal) 2+ (normal)  Femoral 2+ (normal) 2+ (normal)  Popliteal 2+ (normal) 2+ (normal)  AT 2+ (normal) 2+ (normal)   Extremities: without ischemic changes, without Gangrene , without cellulitis; without open wounds;  Musculoskeletal: no muscle wasting or atrophy  Neurologic: A&O X 3;  No focal weakness or paresthesias are detected Psychiatric:  The pt has Normal affect.   Non-Invasive Vascular Imaging:   CT scan 06/04/17 IMPRESSION: 1. No acute findings. 2. Scattered small lung nodules as described. No follow-up needed if patient is low-risk (and has no known or suspected primary neoplasm).  Non-contrast chest CT can be  considered in 12 months if patient is high-risk. This recommendation follows the consensus statement: Guidelines for Management of Incidental Pulmonary Nodules Detected on CT Images: From the Fleischner Society 2017; Radiology 2017; 284:228-243. 3. Mild dilation of the thoracic aorta to 4 cm. Recommend annual imaging followup by CTA or MRA. This recommendation follows 2010 ACCF/AHA/AATS/ACR/ASA/SCA/SCAI/SIR/STS/SVM Guidelines for the Diagnosis and Management of Patients with Thoracic Aortic Disease. Circulation. 2010; 121: W580-D983 4. Significant aortic arch branch vessel atherosclerotic disease, particularly the left subclavian artery. Consider further assessment with CT angiography. 5. Aortic atherosclerosis. 6. Hepatic steatosis. 7. Small left adrenal adenoma.  Pt meds includes: Statin:  No. Beta Blocker:  No. Aspirin:  Yes.   ACEI:  Yes.   ARB:  No. CCB use:  No Other Antiplatelet/Anticoagulant:  No   ASSESSMENT/PLAN:: 84 y.o. female with incidental findings of mild dilatation of thoracic aorta and aortic arch vessel atherosclerosis   -pt has mild dilatation of the thoracic aorta of 4cm and is completely asymptomatic.  Given her age and lack of symptoms, Dr. Donnetta Hutching recommended that she does not need follow up CT scans unless she is being followed for her lung nodules.  It is unlikely that she will become symptomatic from these findings.   -continue blood pressure control  -follow up with vascular surgery as needed.    Leontine Locket, PA-C [facility] [phone]  Clinic MD:  Pt seen and examined with Dr. Donnetta Hutching  I have examined the patient, reviewed and agree with above.  The patient is here with her daughter who is transient for her.  The patient does understand some [place].  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  She  specifically denies any symptoms related to the occlusive disease.  She has no carotid bruits.  She does have 2+ radial pulses and no evidence of vertebrobasilar insufficiency or upper extremity exercise ischemia.  I reassured the patient and her daughter with this.  Would not recommend any further evaluation, treatment or follow-up.  She will continue to follow-up with Dr. Justin Mend regarding her pulmonary nodules.  Curt Jews, MD 07/01/2017 1:44 PM

## 2017-07-02 ENCOUNTER — Encounter: Payer: Self-pay | Admitting: Family Medicine

## 2017-07-06 DIAGNOSIS — H26492 Other secondary cataract, left eye: Secondary | ICD-10-CM | POA: Diagnosis not present

## 2017-07-06 DIAGNOSIS — Z961 Presence of intraocular lens: Secondary | ICD-10-CM | POA: Diagnosis not present

## 2017-07-30 ENCOUNTER — Telehealth: Payer: Self-pay | Admitting: Cardiology

## 2017-07-30 ENCOUNTER — Ambulatory Visit: Payer: Medicare Other | Admitting: Cardiology

## 2017-08-03 ENCOUNTER — Encounter: Payer: Medicare Other | Admitting: Vascular Surgery

## 2017-11-05 DIAGNOSIS — E119 Type 2 diabetes mellitus without complications: Secondary | ICD-10-CM | POA: Diagnosis not present

## 2017-11-05 DIAGNOSIS — K219 Gastro-esophageal reflux disease without esophagitis: Secondary | ICD-10-CM | POA: Diagnosis not present

## 2017-11-05 DIAGNOSIS — M810 Age-related osteoporosis without current pathological fracture: Secondary | ICD-10-CM | POA: Diagnosis not present

## 2017-11-05 DIAGNOSIS — G2581 Restless legs syndrome: Secondary | ICD-10-CM | POA: Diagnosis not present

## 2017-11-05 DIAGNOSIS — I10 Essential (primary) hypertension: Secondary | ICD-10-CM | POA: Diagnosis not present

## 2017-11-05 DIAGNOSIS — R918 Other nonspecific abnormal finding of lung field: Secondary | ICD-10-CM | POA: Diagnosis not present

## 2017-11-05 DIAGNOSIS — R42 Dizziness and giddiness: Secondary | ICD-10-CM | POA: Diagnosis not present

## 2017-11-26 DIAGNOSIS — H401112 Primary open-angle glaucoma, right eye, moderate stage: Secondary | ICD-10-CM | POA: Diagnosis not present

## 2017-11-26 DIAGNOSIS — H02834 Dermatochalasis of left upper eyelid: Secondary | ICD-10-CM | POA: Diagnosis not present

## 2017-11-26 DIAGNOSIS — H02831 Dermatochalasis of right upper eyelid: Secondary | ICD-10-CM | POA: Diagnosis not present

## 2017-11-26 DIAGNOSIS — H53483 Generalized contraction of visual field, bilateral: Secondary | ICD-10-CM | POA: Diagnosis not present

## 2017-11-26 DIAGNOSIS — H401123 Primary open-angle glaucoma, left eye, severe stage: Secondary | ICD-10-CM | POA: Diagnosis not present

## 2017-12-13 DIAGNOSIS — N8111 Cystocele, midline: Secondary | ICD-10-CM | POA: Diagnosis not present

## 2017-12-13 DIAGNOSIS — N952 Postmenopausal atrophic vaginitis: Secondary | ICD-10-CM | POA: Diagnosis not present

## 2017-12-13 DIAGNOSIS — Z124 Encounter for screening for malignant neoplasm of cervix: Secondary | ICD-10-CM | POA: Diagnosis not present

## 2017-12-13 DIAGNOSIS — Z1231 Encounter for screening mammogram for malignant neoplasm of breast: Secondary | ICD-10-CM | POA: Diagnosis not present

## 2018-01-03 DIAGNOSIS — M8588 Other specified disorders of bone density and structure, other site: Secondary | ICD-10-CM | POA: Diagnosis not present

## 2018-01-03 DIAGNOSIS — M810 Age-related osteoporosis without current pathological fracture: Secondary | ICD-10-CM | POA: Diagnosis not present

## 2018-01-07 DIAGNOSIS — H401123 Primary open-angle glaucoma, left eye, severe stage: Secondary | ICD-10-CM | POA: Diagnosis not present

## 2018-01-07 DIAGNOSIS — H4720 Unspecified optic atrophy: Secondary | ICD-10-CM | POA: Diagnosis not present

## 2018-01-07 DIAGNOSIS — H401112 Primary open-angle glaucoma, right eye, moderate stage: Secondary | ICD-10-CM | POA: Diagnosis not present

## 2018-01-07 DIAGNOSIS — H5712 Ocular pain, left eye: Secondary | ICD-10-CM | POA: Diagnosis not present

## 2018-01-24 DIAGNOSIS — H5712 Ocular pain, left eye: Secondary | ICD-10-CM | POA: Diagnosis not present

## 2018-01-24 DIAGNOSIS — H3550 Unspecified hereditary retinal dystrophy: Secondary | ICD-10-CM | POA: Diagnosis not present

## 2018-01-24 DIAGNOSIS — H4720 Unspecified optic atrophy: Secondary | ICD-10-CM | POA: Diagnosis not present

## 2018-01-24 DIAGNOSIS — H53483 Generalized contraction of visual field, bilateral: Secondary | ICD-10-CM | POA: Diagnosis not present

## 2018-02-15 DIAGNOSIS — S838X2A Sprain of other specified parts of left knee, initial encounter: Secondary | ICD-10-CM | POA: Diagnosis not present

## 2018-02-15 DIAGNOSIS — M25562 Pain in left knee: Secondary | ICD-10-CM | POA: Diagnosis not present

## 2018-02-15 DIAGNOSIS — S8392XA Sprain of unspecified site of left knee, initial encounter: Secondary | ICD-10-CM | POA: Diagnosis not present

## 2018-03-16 DIAGNOSIS — M25562 Pain in left knee: Secondary | ICD-10-CM | POA: Diagnosis not present

## 2018-03-16 DIAGNOSIS — M1712 Unilateral primary osteoarthritis, left knee: Secondary | ICD-10-CM | POA: Diagnosis not present

## 2018-05-20 DIAGNOSIS — H401123 Primary open-angle glaucoma, left eye, severe stage: Secondary | ICD-10-CM | POA: Diagnosis not present

## 2018-05-20 DIAGNOSIS — H5712 Ocular pain, left eye: Secondary | ICD-10-CM | POA: Diagnosis not present

## 2018-05-20 DIAGNOSIS — H4720 Unspecified optic atrophy: Secondary | ICD-10-CM | POA: Diagnosis not present

## 2018-05-20 DIAGNOSIS — H401112 Primary open-angle glaucoma, right eye, moderate stage: Secondary | ICD-10-CM | POA: Diagnosis not present

## 2018-05-23 DIAGNOSIS — M6530 Trigger finger, unspecified finger: Secondary | ICD-10-CM | POA: Diagnosis not present

## 2018-05-23 DIAGNOSIS — Z23 Encounter for immunization: Secondary | ICD-10-CM | POA: Diagnosis not present

## 2018-05-23 DIAGNOSIS — G2581 Restless legs syndrome: Secondary | ICD-10-CM | POA: Diagnosis not present

## 2018-05-23 DIAGNOSIS — Z13220 Encounter for screening for lipoid disorders: Secondary | ICD-10-CM | POA: Diagnosis not present

## 2018-05-23 DIAGNOSIS — E1169 Type 2 diabetes mellitus with other specified complication: Secondary | ICD-10-CM | POA: Diagnosis not present

## 2018-05-23 DIAGNOSIS — M8580 Other specified disorders of bone density and structure, unspecified site: Secondary | ICD-10-CM | POA: Diagnosis not present

## 2018-05-23 DIAGNOSIS — J029 Acute pharyngitis, unspecified: Secondary | ICD-10-CM | POA: Diagnosis not present

## 2018-05-23 DIAGNOSIS — R918 Other nonspecific abnormal finding of lung field: Secondary | ICD-10-CM | POA: Diagnosis not present

## 2018-05-23 DIAGNOSIS — I10 Essential (primary) hypertension: Secondary | ICD-10-CM | POA: Diagnosis not present

## 2018-05-25 DIAGNOSIS — J069 Acute upper respiratory infection, unspecified: Secondary | ICD-10-CM | POA: Diagnosis not present

## 2018-05-25 DIAGNOSIS — J029 Acute pharyngitis, unspecified: Secondary | ICD-10-CM | POA: Diagnosis not present

## 2018-05-30 DIAGNOSIS — R918 Other nonspecific abnormal finding of lung field: Secondary | ICD-10-CM | POA: Diagnosis not present

## 2018-05-30 DIAGNOSIS — Z13220 Encounter for screening for lipoid disorders: Secondary | ICD-10-CM | POA: Diagnosis not present

## 2018-05-30 DIAGNOSIS — M8580 Other specified disorders of bone density and structure, unspecified site: Secondary | ICD-10-CM | POA: Diagnosis not present

## 2018-05-30 DIAGNOSIS — E1169 Type 2 diabetes mellitus with other specified complication: Secondary | ICD-10-CM | POA: Diagnosis not present

## 2018-06-01 ENCOUNTER — Other Ambulatory Visit: Payer: Self-pay | Admitting: Family Medicine

## 2018-06-01 DIAGNOSIS — R918 Other nonspecific abnormal finding of lung field: Secondary | ICD-10-CM

## 2018-07-05 ENCOUNTER — Ambulatory Visit
Admission: RE | Admit: 2018-07-05 | Discharge: 2018-07-05 | Disposition: A | Discharge status: Home / Self Care | Payer: Medicare Other | Source: Ambulatory Visit | Attending: Family Medicine | Admitting: Family Medicine

## 2018-07-05 DIAGNOSIS — R918 Other nonspecific abnormal finding of lung field: Secondary | ICD-10-CM

## 2018-07-29 DIAGNOSIS — E1140 Type 2 diabetes mellitus with diabetic neuropathy, unspecified: Secondary | ICD-10-CM | POA: Diagnosis not present

## 2018-07-29 DIAGNOSIS — E0590 Thyrotoxicosis, unspecified without thyrotoxic crisis or storm: Secondary | ICD-10-CM | POA: Diagnosis not present

## 2018-07-29 DIAGNOSIS — G629 Polyneuropathy, unspecified: Secondary | ICD-10-CM | POA: Diagnosis not present

## 2018-07-29 DIAGNOSIS — R413 Other amnesia: Secondary | ICD-10-CM | POA: Diagnosis not present

## 2018-07-29 DIAGNOSIS — G4700 Insomnia, unspecified: Secondary | ICD-10-CM | POA: Diagnosis not present

## 2018-09-02 DIAGNOSIS — H4720 Unspecified optic atrophy: Secondary | ICD-10-CM | POA: Diagnosis not present

## 2018-09-02 DIAGNOSIS — H5712 Ocular pain, left eye: Secondary | ICD-10-CM | POA: Diagnosis not present

## 2018-09-02 DIAGNOSIS — H401112 Primary open-angle glaucoma, right eye, moderate stage: Secondary | ICD-10-CM | POA: Diagnosis not present

## 2018-09-02 DIAGNOSIS — H401123 Primary open-angle glaucoma, left eye, severe stage: Secondary | ICD-10-CM | POA: Diagnosis not present

## 2019-01-06 DIAGNOSIS — E0590 Thyrotoxicosis, unspecified without thyrotoxic crisis or storm: Secondary | ICD-10-CM | POA: Diagnosis not present

## 2019-01-06 DIAGNOSIS — Z0000 Encounter for general adult medical examination without abnormal findings: Secondary | ICD-10-CM | POA: Diagnosis not present

## 2019-01-06 DIAGNOSIS — M8580 Other specified disorders of bone density and structure, unspecified site: Secondary | ICD-10-CM | POA: Diagnosis not present

## 2019-01-06 DIAGNOSIS — M85852 Other specified disorders of bone density and structure, left thigh: Secondary | ICD-10-CM | POA: Diagnosis not present

## 2019-01-06 DIAGNOSIS — E1140 Type 2 diabetes mellitus with diabetic neuropathy, unspecified: Secondary | ICD-10-CM | POA: Diagnosis not present

## 2019-01-06 DIAGNOSIS — E1169 Type 2 diabetes mellitus with other specified complication: Secondary | ICD-10-CM | POA: Diagnosis not present

## 2019-01-09 DIAGNOSIS — M6530 Trigger finger, unspecified finger: Secondary | ICD-10-CM | POA: Diagnosis not present

## 2019-01-09 DIAGNOSIS — E1140 Type 2 diabetes mellitus with diabetic neuropathy, unspecified: Secondary | ICD-10-CM | POA: Diagnosis not present

## 2019-01-09 DIAGNOSIS — E1169 Type 2 diabetes mellitus with other specified complication: Secondary | ICD-10-CM | POA: Diagnosis not present

## 2019-01-09 DIAGNOSIS — N390 Urinary tract infection, site not specified: Secondary | ICD-10-CM | POA: Diagnosis not present

## 2019-01-09 DIAGNOSIS — Z0000 Encounter for general adult medical examination without abnormal findings: Secondary | ICD-10-CM | POA: Diagnosis not present

## 2019-01-09 DIAGNOSIS — I10 Essential (primary) hypertension: Secondary | ICD-10-CM | POA: Diagnosis not present

## 2019-01-09 DIAGNOSIS — M8580 Other specified disorders of bone density and structure, unspecified site: Secondary | ICD-10-CM | POA: Diagnosis not present

## 2019-01-24 IMAGING — CR DG SACRUM/COCCYX 2+V
3 series · 3 of 3 positions shown · non-contrast
Comparison: None.

CLINICAL DATA: Continued coccyx pain after fall times a few days
ago.

EXAM:
SACRUM AND COCCYX - 2+ VIEW

[t sacrum ap]
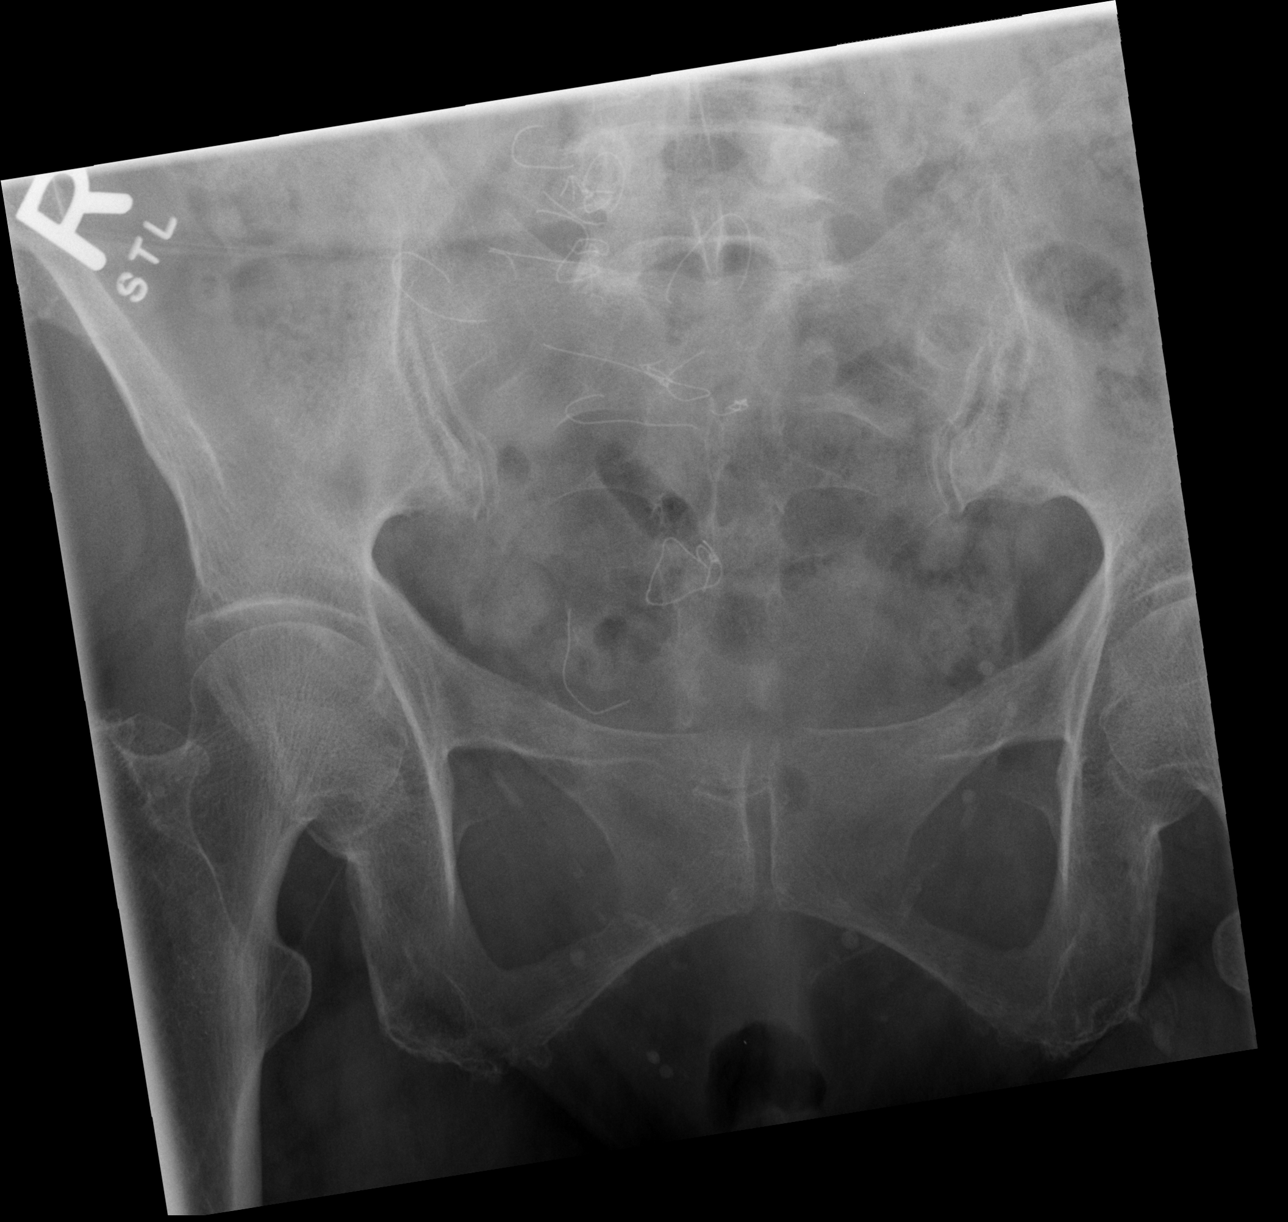

[t coccyx ap]
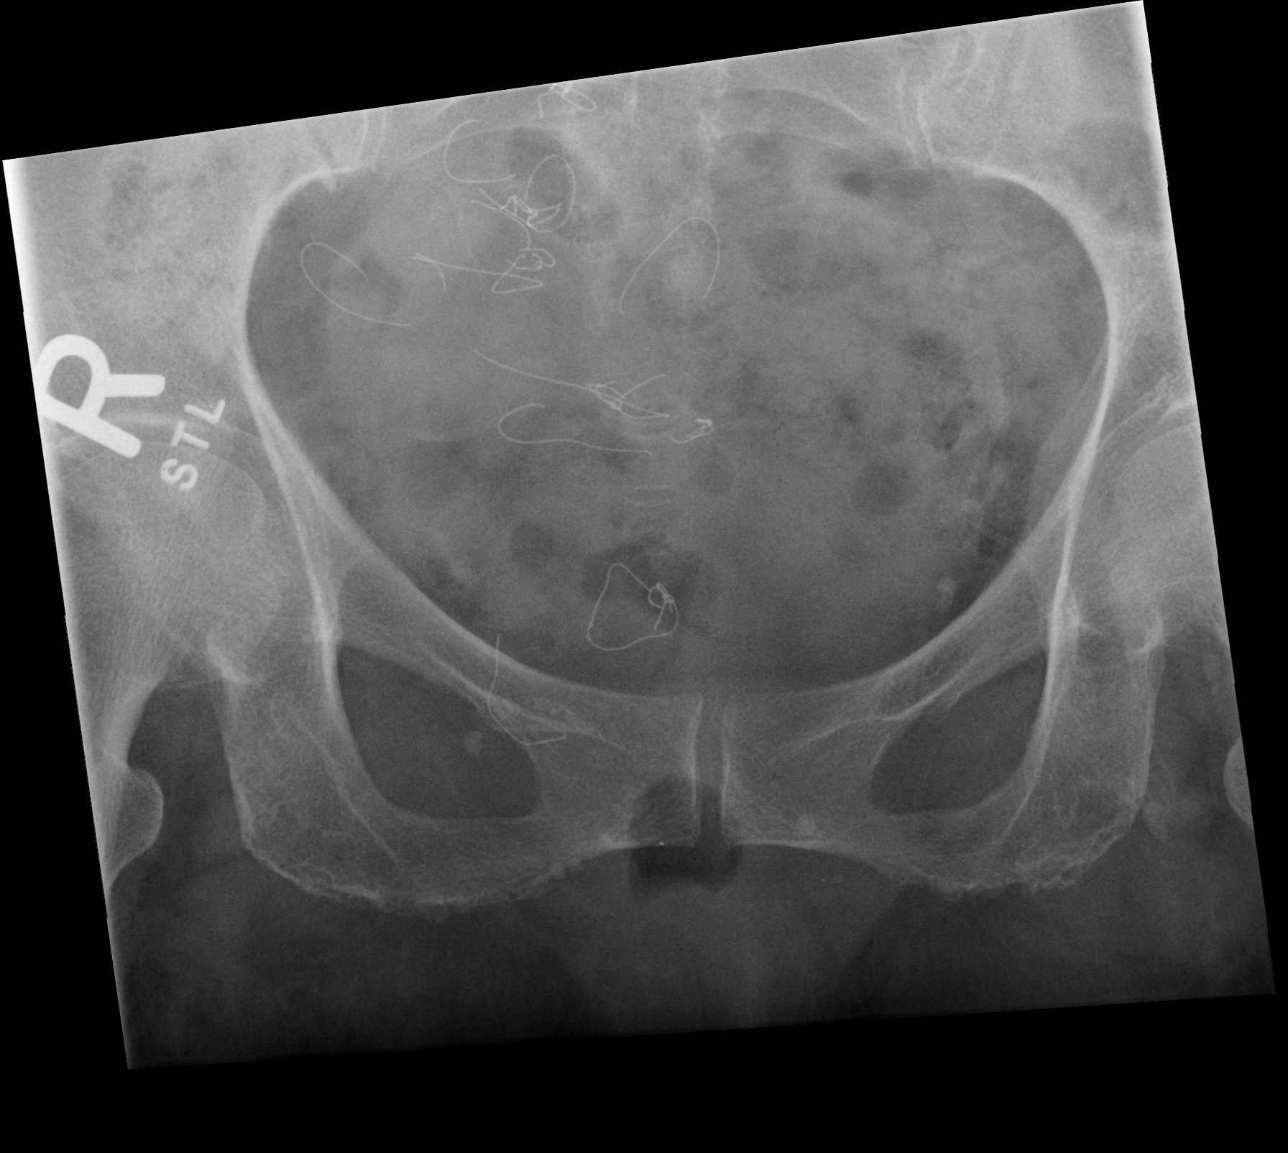

[t sacrum coccyx lat]
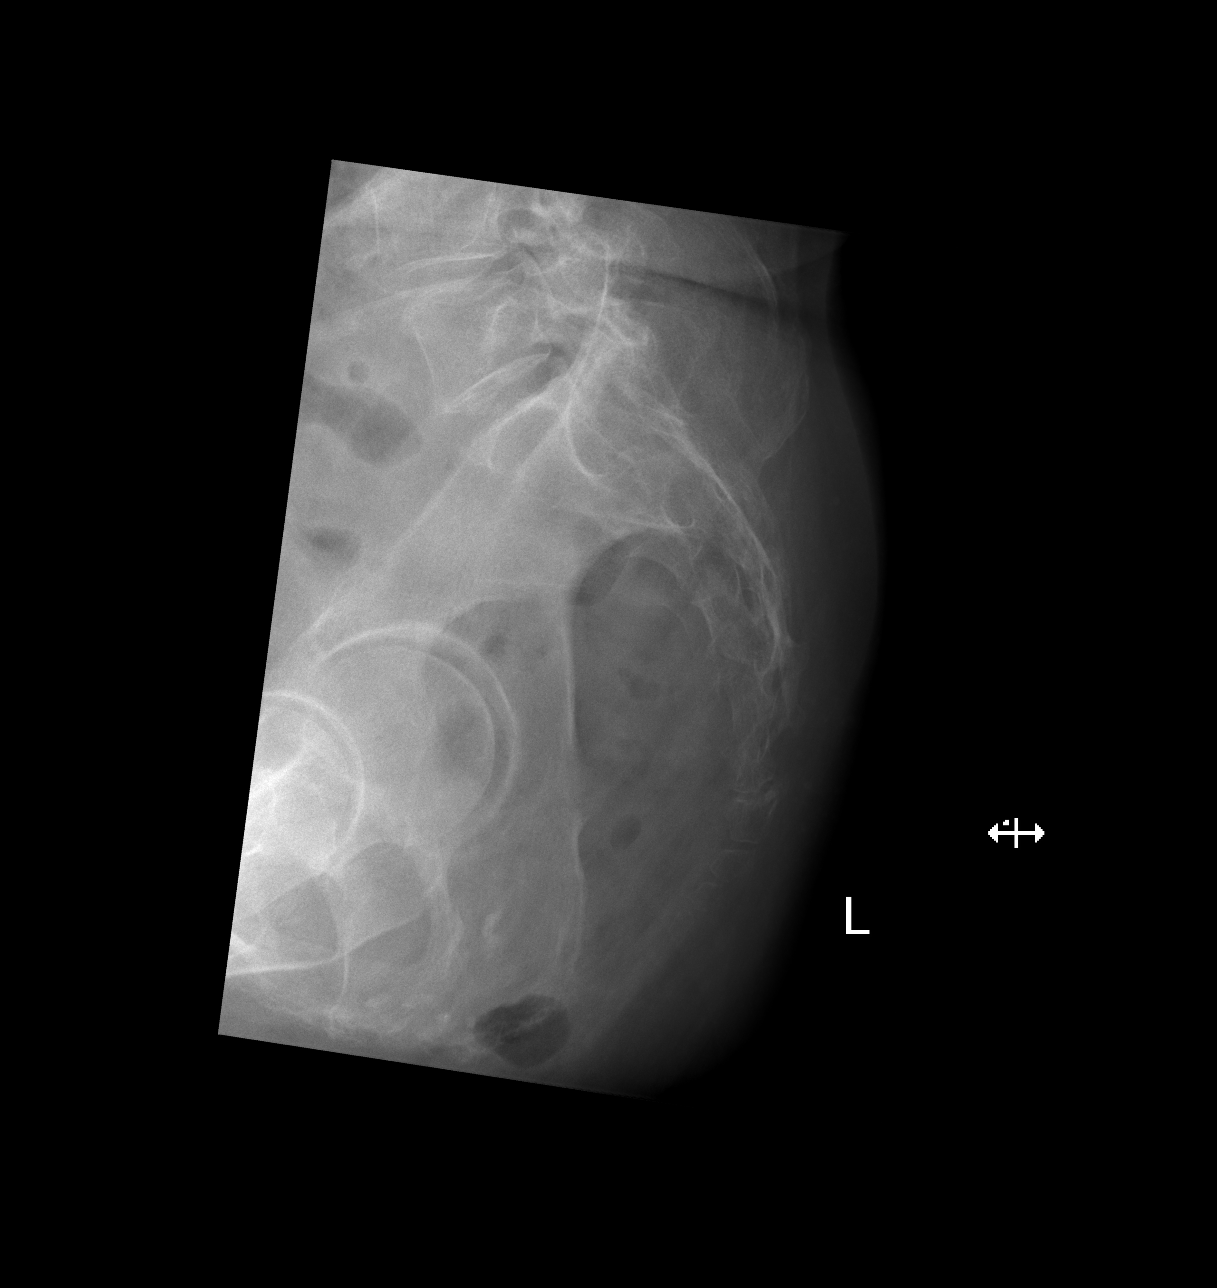

[3 of 3 positions shown; findings below may reference images not displayed]

FINDINGS: The sacroiliac joints and pubic symphysis are maintained. The sacrum
and coccyx are aligned without apparent fracture radiographically.
The arcuate lines the sacrum appear intact. The visualized included
hips are maintained within their respective hip joints bilaterally.
Sutures project over the mid pelvis.
IMPRESSION: No radiographic evidence of fracture.

## 2019-02-06 DIAGNOSIS — H401112 Primary open-angle glaucoma, right eye, moderate stage: Secondary | ICD-10-CM | POA: Diagnosis not present

## 2019-02-06 DIAGNOSIS — H5712 Ocular pain, left eye: Secondary | ICD-10-CM | POA: Diagnosis not present

## 2019-02-06 DIAGNOSIS — H401123 Primary open-angle glaucoma, left eye, severe stage: Secondary | ICD-10-CM | POA: Diagnosis not present

## 2019-02-06 DIAGNOSIS — H4720 Unspecified optic atrophy: Secondary | ICD-10-CM | POA: Diagnosis not present

## 2019-02-28 DIAGNOSIS — N952 Postmenopausal atrophic vaginitis: Secondary | ICD-10-CM | POA: Diagnosis not present

## 2019-02-28 DIAGNOSIS — Z1231 Encounter for screening mammogram for malignant neoplasm of breast: Secondary | ICD-10-CM | POA: Diagnosis not present

## 2019-02-28 DIAGNOSIS — Z124 Encounter for screening for malignant neoplasm of cervix: Secondary | ICD-10-CM | POA: Diagnosis not present

## 2019-02-28 DIAGNOSIS — N8111 Cystocele, midline: Secondary | ICD-10-CM | POA: Diagnosis not present

## 2019-02-28 DIAGNOSIS — R3915 Urgency of urination: Secondary | ICD-10-CM | POA: Diagnosis not present

## 2019-03-19 DIAGNOSIS — Z23 Encounter for immunization: Secondary | ICD-10-CM | POA: Diagnosis not present

## 2019-04-14 DIAGNOSIS — M25562 Pain in left knee: Secondary | ICD-10-CM | POA: Diagnosis not present

## 2019-04-14 DIAGNOSIS — E1169 Type 2 diabetes mellitus with other specified complication: Secondary | ICD-10-CM | POA: Diagnosis not present

## 2019-04-25 DIAGNOSIS — M25562 Pain in left knee: Secondary | ICD-10-CM | POA: Diagnosis not present

## 2019-04-25 DIAGNOSIS — M1712 Unilateral primary osteoarthritis, left knee: Secondary | ICD-10-CM | POA: Diagnosis not present

## 2019-06-05 ENCOUNTER — Other Ambulatory Visit: Payer: Self-pay | Admitting: Family Medicine

## 2019-06-05 DIAGNOSIS — M7632 Iliotibial band syndrome, left leg: Secondary | ICD-10-CM | POA: Diagnosis not present

## 2019-06-05 DIAGNOSIS — R911 Solitary pulmonary nodule: Secondary | ICD-10-CM

## 2019-06-05 DIAGNOSIS — M1712 Unilateral primary osteoarthritis, left knee: Secondary | ICD-10-CM | POA: Diagnosis not present

## 2019-07-12 DIAGNOSIS — M7632 Iliotibial band syndrome, left leg: Secondary | ICD-10-CM | POA: Diagnosis not present

## 2019-07-14 DIAGNOSIS — I10 Essential (primary) hypertension: Secondary | ICD-10-CM | POA: Diagnosis not present

## 2019-07-14 DIAGNOSIS — R300 Dysuria: Secondary | ICD-10-CM | POA: Diagnosis not present

## 2019-07-14 DIAGNOSIS — E1169 Type 2 diabetes mellitus with other specified complication: Secondary | ICD-10-CM | POA: Diagnosis not present

## 2019-07-18 DIAGNOSIS — M7632 Iliotibial band syndrome, left leg: Secondary | ICD-10-CM | POA: Diagnosis not present

## 2019-07-20 ENCOUNTER — Other Ambulatory Visit: Payer: Self-pay | Admitting: Family Medicine

## 2019-07-21 ENCOUNTER — Ambulatory Visit
Admission: RE | Admit: 2019-07-21 | Discharge: 2019-07-21 | Disposition: A | Discharge status: Home / Self Care | Payer: Medicare Other | Source: Ambulatory Visit | Attending: Family Medicine | Admitting: Family Medicine

## 2019-07-21 ENCOUNTER — Other Ambulatory Visit: Payer: Self-pay

## 2019-07-21 DIAGNOSIS — R918 Other nonspecific abnormal finding of lung field: Secondary | ICD-10-CM | POA: Diagnosis not present

## 2019-07-21 DIAGNOSIS — R911 Solitary pulmonary nodule: Secondary | ICD-10-CM

## 2019-07-25 IMAGING — CT CT CHEST W/O CM
1 series · 14 of 34 positions shown, 18 images · non-contrast
Comparison: None.

CLINICAL DATA: Lung nodules.

EXAM:
CT CHEST WITHOUT CONTRAST
TECHNIQUE: Multidetector CT imaging of the chest was performed following the
standard protocol without IV contrast.

[Series 2: chest w/(date) · axial · 0.74mm/px · z∈[-296,-64]mm · 14 of 138 slices shown, 18 images]
[im 11/138  mediastinal]
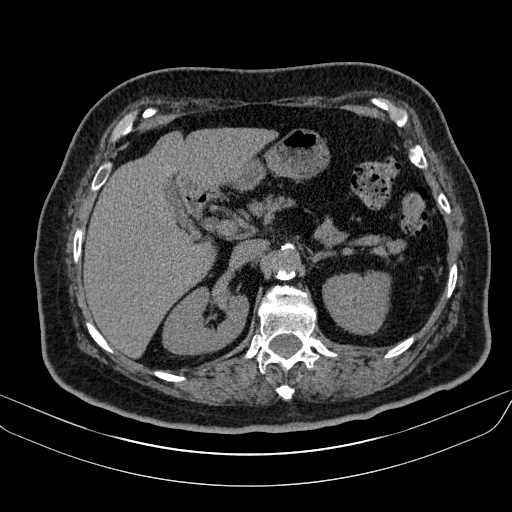
[im 11/138  lung]
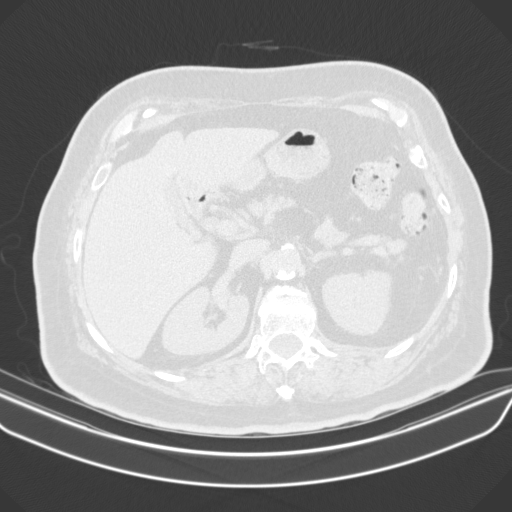
[im 21/138  lung]
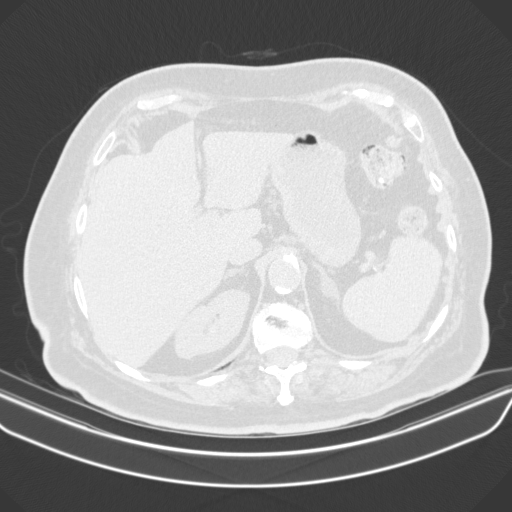
[im 28/138  lung]
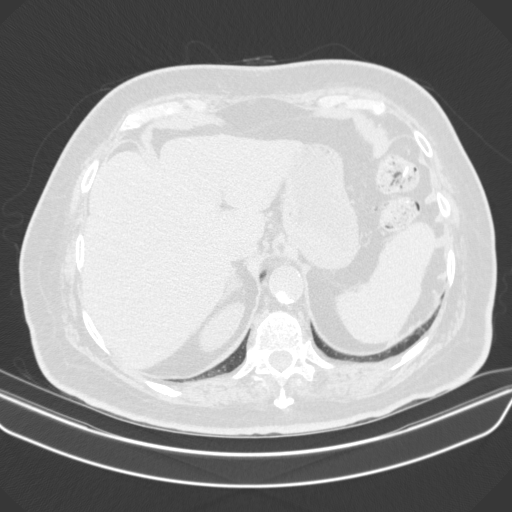
[im 41/138  lung]
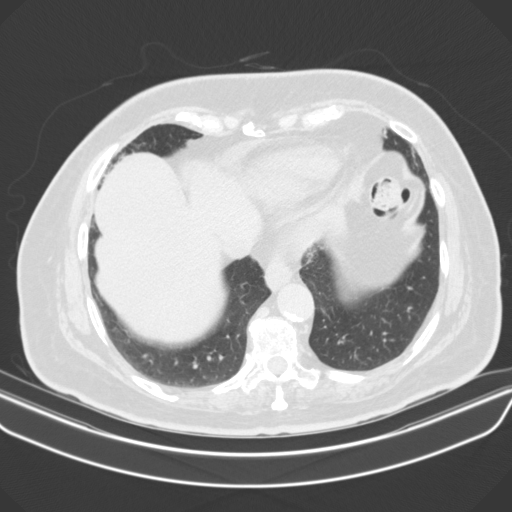
[im 51/138  mediastinal]
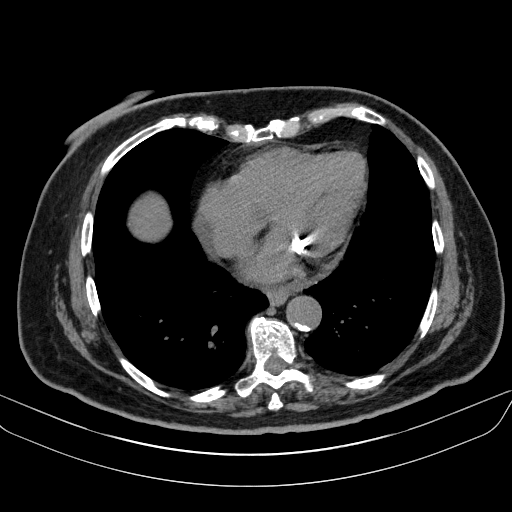
[im 51/138  lung]
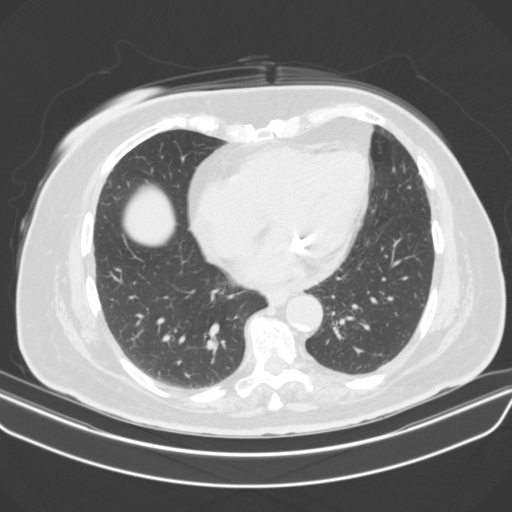
[im 56/138  lung]
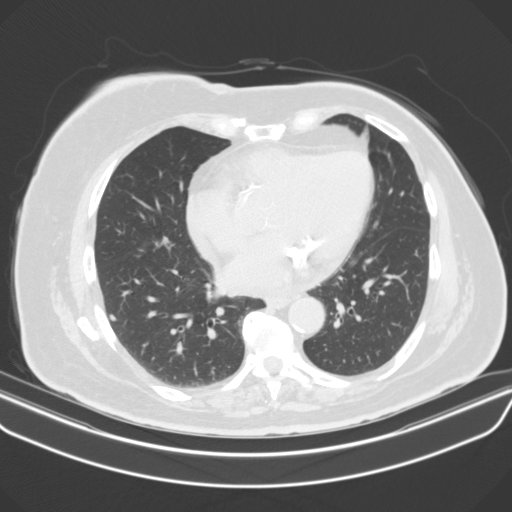
[im 65/138  lung]
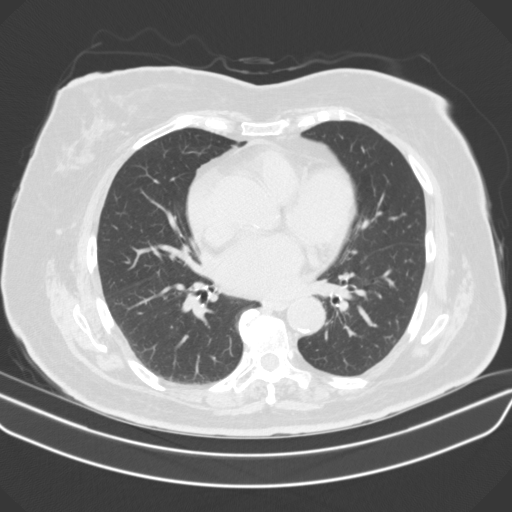
[im 74/138  lung]
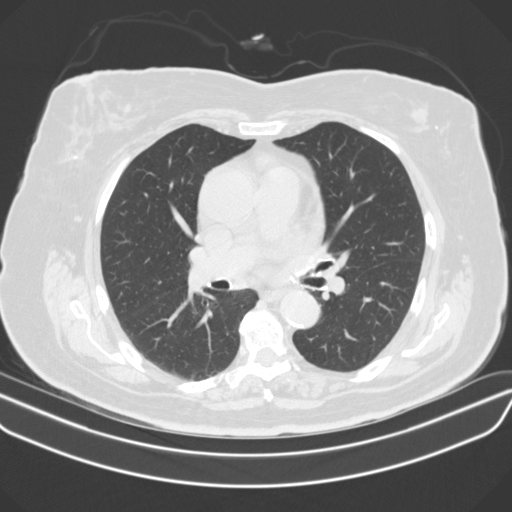
[im 82/138  mediastinal]
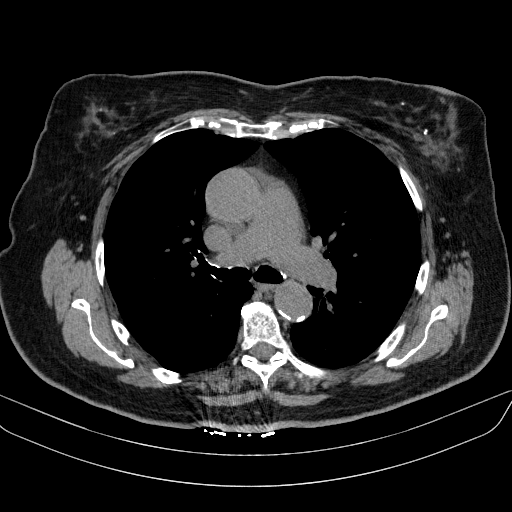
[im 82/138  lung]
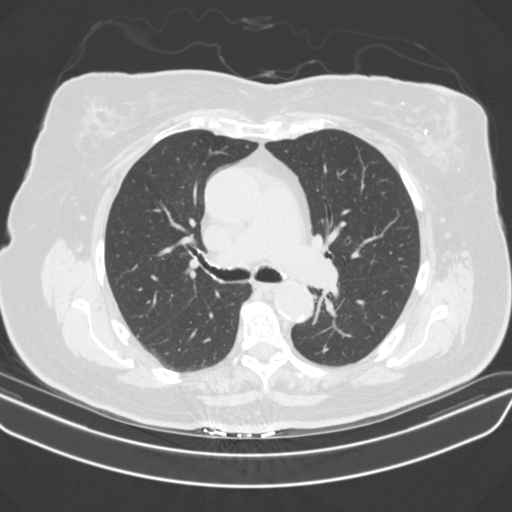
[im 87/138  lung]
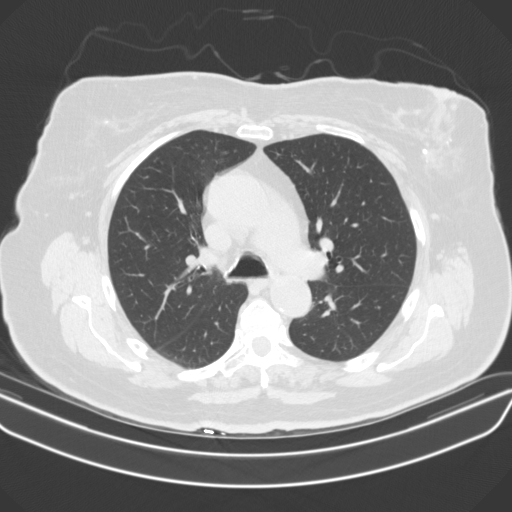
[im 102/138  lung]
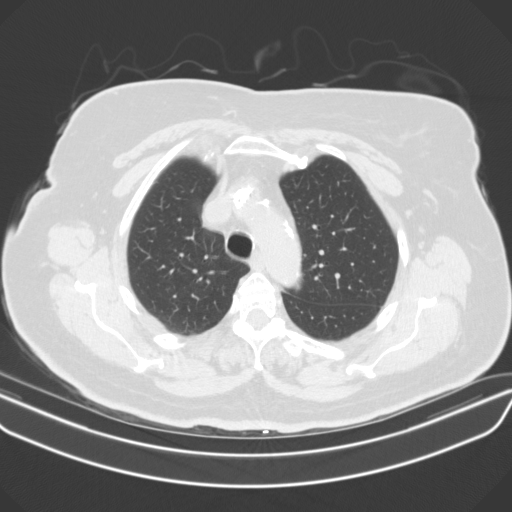
[im 110/138  lung]
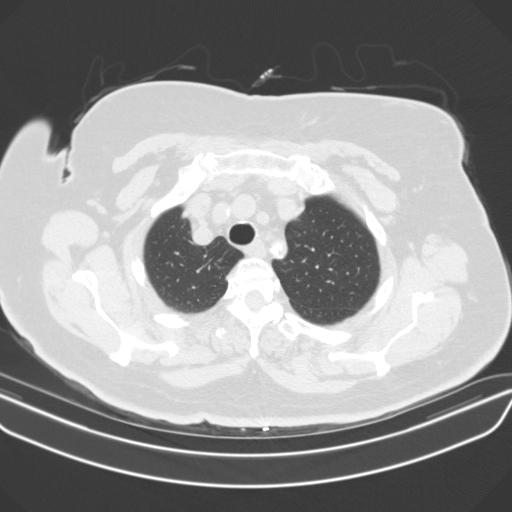
[im 117/138  mediastinal]
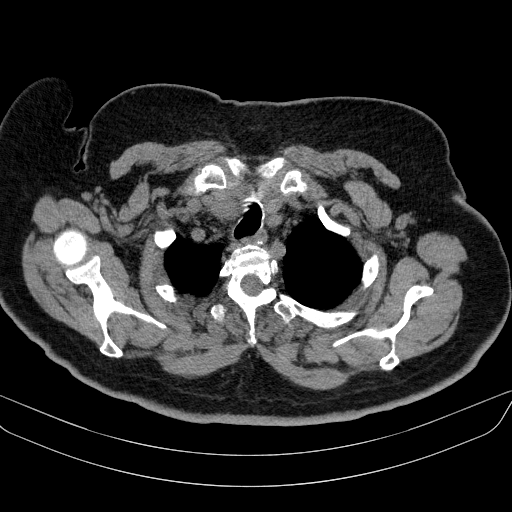
[im 117/138  lung]
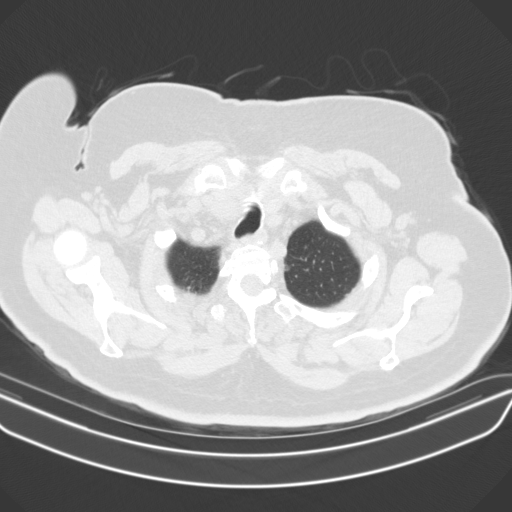
[im 127/138  lung]
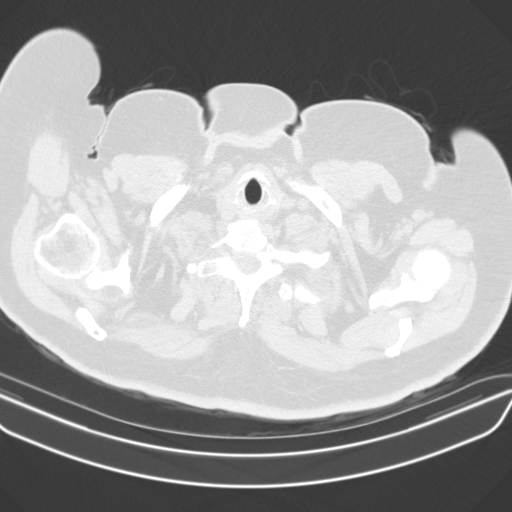

[14 of 34 positions shown; findings below may reference images not displayed]

FINDINGS: Cardiovascular: Heart is normal size. Trace pericardial fluid. Mild
left coronary artery calcifications. Thoracic aorta is dilated to 4
cm along its ascending portion. There are atherosclerotic
calcifications along the aortic arch and descending thoracic aorta.
Atherosclerotic calcifications are noted at the origin of the arch
branch vessels, with significant narrowing of the left subclavian
artery and moderate narrowing of the innominate artery.

Mediastinum/Nodes: No neck base or axillary masses or enlarged lymph
nodes. No mediastinal or hilar masses or adenopathy. Trachea is
widely patent. Esophagus is unremarkable.

Lungs/Pleura: Scattered small nodules. 6 x 4 mm, mean 5 mm, right
middle lobe nodule, image 78, series [DATE] x 3 mm, mean 4 mm, right
lower lobe nodule, image 83. 7 x 3 mm, mean 5 mm, pleural-based
nodule in the left lower lobe, image 69. 4 mm nodule in the left
lower lobe, image 57. 3 mm nodule right lower lobe, image 72. Small
irregular nodule, most likely scarring, in the left upper lobe at
the apex, image 20. 3 mm pleural-based nodule, left lower lobe,
image 86.

Reticular opacities are noted at the base of the right middle lobe
and left upper lobe lingula consistent with scarring. Minor scarring
noted at the lung apices. No evidence of pneumonia or pulmonary
edema. No pleural effusion or pneumothorax.

Upper Abdomen: No acute findings. Decreased attenuation of the liver
consistent with fatty infiltration. 1 cm left adrenal nodule,
average Hounsfield units of 4, reflecting an adenoma. Small sliding
hiatal hernia.

Musculoskeletal: No fracture or acute finding. No osteoblastic or
osteolytic lesions.
IMPRESSION: 1. No acute findings.
2. Scattered small lung nodules as described. No follow-up needed if
patient is low-risk (and has no known or suspected primary
neoplasm). Non-contrast chest CT can be considered in 12 months if
patient is high-risk. This recommendation follows the consensus
statement: Guidelines for Management of Incidental Pulmonary Nodules
Detected on CT Images: From the [HOSPITAL] 0703; Radiology
0703; [DATE].
3. Mild dilation of the thoracic aorta to 4 cm. Recommend annual
imaging followup by CTA or MRA. This recommendation follows 5969
ACCF/AHA/AATS/ACR/ASA/SCA/KUATE/GREFG/CLEA/BEVZ Guidelines for the
Diagnosis and Management of Patients with Thoracic Aortic Disease.
Circulation. 5969; 121: e266-e369
4. Significant aortic arch branch vessel atherosclerotic disease,
particularly the left subclavian artery. Consider further assessment
with CT angiography.
5. Aortic atherosclerosis.
6. Hepatic steatosis.
7. Small left adrenal adenoma.

Aortic Atherosclerosis ([id number]-G55.5).

## 2019-08-03 DIAGNOSIS — Z86010 Personal history of colonic polyps: Secondary | ICD-10-CM | POA: Diagnosis not present

## 2019-08-03 DIAGNOSIS — K219 Gastro-esophageal reflux disease without esophagitis: Secondary | ICD-10-CM | POA: Diagnosis not present

## 2019-08-03 DIAGNOSIS — Z1211 Encounter for screening for malignant neoplasm of colon: Secondary | ICD-10-CM | POA: Diagnosis not present

## 2019-10-13 DIAGNOSIS — H53483 Generalized contraction of visual field, bilateral: Secondary | ICD-10-CM | POA: Diagnosis not present

## 2019-10-13 DIAGNOSIS — H401123 Primary open-angle glaucoma, left eye, severe stage: Secondary | ICD-10-CM | POA: Diagnosis not present

## 2019-10-13 DIAGNOSIS — H401112 Primary open-angle glaucoma, right eye, moderate stage: Secondary | ICD-10-CM | POA: Diagnosis not present

## 2019-10-13 DIAGNOSIS — H4720 Unspecified optic atrophy: Secondary | ICD-10-CM | POA: Diagnosis not present

## 2019-11-24 DIAGNOSIS — H401123 Primary open-angle glaucoma, left eye, severe stage: Secondary | ICD-10-CM | POA: Diagnosis not present

## 2019-11-24 DIAGNOSIS — H401112 Primary open-angle glaucoma, right eye, moderate stage: Secondary | ICD-10-CM | POA: Diagnosis not present

## 2019-12-05 DIAGNOSIS — R5383 Other fatigue: Secondary | ICD-10-CM | POA: Diagnosis not present

## 2019-12-05 DIAGNOSIS — R6889 Other general symptoms and signs: Secondary | ICD-10-CM | POA: Diagnosis not present

## 2019-12-25 ENCOUNTER — Telehealth: Payer: Self-pay

## 2019-12-25 NOTE — Telephone Encounter (Signed)
FAXED NOTES TO [facility] 

## 2019-12-28 DIAGNOSIS — R6889 Other general symptoms and signs: Secondary | ICD-10-CM | POA: Insufficient documentation

## 2019-12-28 DIAGNOSIS — Z7189 Other specified counseling: Secondary | ICD-10-CM | POA: Insufficient documentation

## 2019-12-28 NOTE — Progress Notes (Signed)
Cardiology Office Note   Date:  12/29/2019   ID:  Karen Blankenship, DOB 1934/04/06, MRN [id number]  PCP:  London Pepper, MD  Cardiologist:   No primary care provider on file. Referring:  London Pepper, MD  Chief Complaint  Patient presents with  . DOE      History of Present Illness: Karen Blankenship is a 86 y.o. female who is referred by London Pepper, MD for evaluation of decreased exercise tolerance.  She has history of subclavian artery stenosis.  However, she saw Dr. Charlestine Massed years ago and he did not think she had any symptoms related to this.  This was picked up when she lived not being [place].  She has aortic atherosclerosis and mildly enlarged aortic root and again was followed.  She is brought here today because she has been having some decreased exercise tolerance.  She is very fatigued try to do things around her house.  She lives with her daughter.  She has weakness and fatigue.  She is not having any chest pressure, neck or arm discomfort.  She is not having any new shortness of breath, PND or orthopnea.  Turns out she only sleeps a couple of hours a day.  She is not having any new weight gain or edema.  She not having any new palpitations, presyncope or syncope.   Past Medical History:  Diagnosis Date  . Acid reflux   . Acquired polyneuropathy   . Aortic atherosclerosis ([place])   . Aortic root dilation ([facility])   . BMI 30.0-30.9,adult   . Chronic insomnia   . Diabetic neuropathy ([place])   . DM (diabetes mellitus) ([place])   . Frequent UTI   . GERD (gastroesophageal reflux disease)   . Glaucoma   . Hypercholesterolemia   . Hypertension   . Hyperthyroidism   . Loss of memory   . Osteopenia   . Osteoporosis   . RLS (restless legs syndrome)   . Subclavian artery stenosis [facility])     Past Surgical History:  Procedure Laterality Date  . ABDOMINAL HYSTERECTOMY    . BLADDER SUSPENSION    . CYST REMOVAL TRUNK    . KNEE SURGERY       Current Outpatient Medications  Medication  Sig Dispense Refill  . alendronate (FOSAMAX) 70 MG tablet Take 70 mg by mouth once a week. Take with a full glass of water on an empty stomach.    Marland Kitchen aspirin 81 MG chewable tablet Chew 81 mg by mouth daily.    . brimonidine (ALPHAGAN) 0.2 % ophthalmic solution 3 (three) times daily.    . calcium acetate (PHOSLO) 667 MG capsule Take 667 mg by mouth 3 (three) times daily with meals.    . cephALEXin (KEFLEX) 500 MG capsule Take 500 mg by mouth 2 (two) times daily.    . Cholecalciferol (D3-1000 PO) Take 2,000 mg by mouth daily.    . Cyanocobalamin (VITAMIN B 12 PO) Take 1 tablet by mouth daily.    Marland Kitchen estrogens, conjugated, (PREMARIN) 0.625 MG tablet Take 0.625 mg by mouth daily. Take daily for 21 days then do not take for 7 days.    Marland Kitchen gabapentin (NEURONTIN) 100 MG capsule Take 100 mg by mouth. Patient takes one pill every 8 hours.    Marland Kitchen ibuprofen (ADVIL,MOTRIN) 600 MG tablet Take 600 mg by mouth every 6 (six) hours as needed for pain.    Marland Kitchen lisinopril-hydrochlorothiazide (ZESTORETIC) 20-12.5 MG tablet Take 1 tablet by mouth daily.    Marland Kitchen  Omega-3 Fatty Acids (FISH OIL) 500 MG CAPS Take by mouth.    Marland Kitchen omeprazole (PRILOSEC) 20 MG capsule Take 20 mg by mouth daily.    . Timolol Maleate PF 0.25 % SOLN Apply to eye.     No current facility-administered medications for this visit.    Allergies:   Quinine derivatives, Sulfa antibiotics, and Tramadol    Social History:  The patient  reports that she has never smoked. She has never used smokeless tobacco. She reports that she does not drink alcohol and does not use drugs.   Family History:  The patient's family history includes Diabetes in her father; Heart attack in her mother.    ROS:  Please see the history of present illness.   Otherwise, review of systems are positive for none.   All other systems are reviewed and negative.    PHYSICAL EXAM: VS:  BP 139/74   Pulse (!) 58   Ht 5' (1.524 m)   Wt 165 lb 3.2 oz (74.9 kg)   SpO2 96%   BMI 32.26 kg/m   , BMI Body mass index is 32.26 kg/m. GENERAL:  Well appearing HEENT:  Pupils equal round and reactive, fundi not visualized, oral mucosa unremarkable NECK:  No jugular venous distention, waveform within normal limits, carotid upstroke brisk and symmetric, no bruits, no thyromegaly LYMPHATICS:  No cervical, inguinal adenopathy LUNGS:  Clear to auscultation bilaterally BACK:  No CVA tenderness CHEST:  Unremarkable HEART:  PMI not displaced or sustained,S1 and S2 within normal limits, no S3, no S4, no clicks, no rubs, no murmurs ABD:  Flat, positive bowel sounds normal in frequency in pitch, no bruits, no rebound, no guarding, no midline pulsatile mass, no hepatomegaly, no splenomegaly EXT:  2 plus pulses throughout, no edema, no cyanosis no clubbing SKIN:  No rashes no nodules NEURO:  Cranial nerves II through XII grossly intact, motor grossly intact throughout PSYCH:  Cognitively intact, oriented to person place and time    EKG:  EKG is ordered today. The ekg ordered today demonstrates sinus rhythm, rate 58, axis within normal limits, intervals within normal limits, no acute ST-T wave changes.   Recent Labs: No results found for requested labs within last 8760 hours.    Lipid Panel No results found for: CHOL, TRIG, HDL, CHOLHDL, VLDL, LDLCALC, LDLDIRECT    Wt Readings from Last 3 Encounters:  12/29/19 165 lb 3.2 oz (74.9 kg)  07/01/17 165 lb (74.8 kg)      Other studies Reviewed: Additional studies/ records that were reviewed today include: [facility] records.  PCP records. Review of the above records demonstrates:  Please see elsewhere in the note.     ASSESSMENT AND PLAN:  DECREASED EXERCISE TOLERANCE:   This decreased exercise tolerance certainly could be an anginal equivalent.  She has known vascular disease as described.  Given this would like to screen her with a treadmill test but I do not think she be able to walk on a treadmill.  Therefore, she will have a [employer].  I do note that she has had blood work to include normal electrolytes and TSH recently.  INSOMNIA: I have suggested referral to Dr. Brett Fairy as patient has tried some medical therapies before.    SUBCLAVIAN STENOSIS: She does not have symptoms related to this.  She does have a differential blood pressure and I suggested that they take the blood pressure in the right arm for follow-up in the future.  HTN: Blood pressure is mildly  elevated in the right arm.  Given the goals of therapy and he will keep a blood pressure diary.   Current medicines are reviewed at length with the patient today.  The patient does not have concerns regarding medicines.  The following changes have been made:  no change  Labs/ tests ordered today include:   Orders Placed This Encounter  Procedures  . MYOCARDIAL PERFUSION IMAGING  . EKG 12-Lead     Disposition:   FU with me as needed.      Signed, Minus Breeding, MD  12/29/2019 5:25 PM    [facility]

## 2019-12-29 ENCOUNTER — Encounter: Payer: Self-pay | Admitting: Cardiology

## 2019-12-29 ENCOUNTER — Other Ambulatory Visit: Payer: Self-pay

## 2019-12-29 ENCOUNTER — Ambulatory Visit (INDEPENDENT_AMBULATORY_CARE_PROVIDER_SITE_OTHER): Payer: Medicare Other | Admitting: Cardiology

## 2019-12-29 VITALS — BP 139/74 | HR 58 | Ht 60.0 in | Wt 165.2 lb

## 2019-12-29 DIAGNOSIS — I739 Peripheral vascular disease, unspecified: Secondary | ICD-10-CM | POA: Diagnosis not present

## 2019-12-29 DIAGNOSIS — R6889 Other general symptoms and signs: Secondary | ICD-10-CM | POA: Diagnosis not present

## 2019-12-29 DIAGNOSIS — Z7189 Other specified counseling: Secondary | ICD-10-CM | POA: Diagnosis not present

## 2019-12-29 DIAGNOSIS — R0602 Shortness of breath: Secondary | ICD-10-CM

## 2019-12-29 NOTE — Patient Instructions (Addendum)
Medication Instructions:  Your physician recommends that you continue on your current medications as directed. Please refer to the Current Medication list given to you today.  Lab Work: NONE   Testing/Procedures: Your physician has requested that you have a lexiscan myoview. For further information please visit [email or web address]. Please follow instruction sheet, as given.  Follow-Up: AS NEEDED   Other Instructions  DR CARMEN DOHMEIER IS THE NEUROLOGIST NAME

## 2020-01-08 ENCOUNTER — Other Ambulatory Visit: Payer: Self-pay | Admitting: *Deleted

## 2020-01-08 DIAGNOSIS — G4700 Insomnia, unspecified: Secondary | ICD-10-CM

## 2020-01-08 NOTE — Progress Notes (Signed)
amb re 

## 2020-01-10 ENCOUNTER — Telehealth (HOSPITAL_COMMUNITY): Payer: Self-pay

## 2020-01-10 NOTE — Telephone Encounter (Signed)
Encounter complete. 

## 2020-01-12 ENCOUNTER — Other Ambulatory Visit: Payer: Self-pay

## 2020-01-12 ENCOUNTER — Ambulatory Visit (HOSPITAL_COMMUNITY)
Admission: RE | Admit: 2020-01-12 | Discharge: 2020-01-12 | Disposition: A | Discharge status: Home / Self Care | Payer: Medicare Other | Source: Ambulatory Visit | Attending: Cardiology | Admitting: Cardiology

## 2020-01-12 DIAGNOSIS — R6889 Other general symptoms and signs: Secondary | ICD-10-CM

## 2020-01-12 DIAGNOSIS — I739 Peripheral vascular disease, unspecified: Secondary | ICD-10-CM

## 2020-01-12 DIAGNOSIS — R0602 Shortness of breath: Secondary | ICD-10-CM

## 2020-01-12 LAB — MYOCARDIAL PERFUSION IMAGING
LV dias vol: 73 mL (ref 46–106)
LV sys vol: 24 mL
Peak HR: 73 {beats}/min
Rest HR: 51 {beats}/min
SDS: 3
SRS: 0
SSS: 3
TID: 1.04

## 2020-01-12 MED ORDER — REGADENOSON 0.4 MG/5ML IV SOLN
0.4000 mg | Freq: Once | INTRAVENOUS | Status: AC
  Administered 2020-01-12: 0.4 mg via INTRAVENOUS

## 2020-01-12 MED ORDER — TECHNETIUM TC 99M TETROFOSMIN IV KIT
30.6000 | PACK | Freq: Once | INTRAVENOUS | Status: AC | PRN
  Administered 2020-01-12: 15:00:00 30.6 via INTRAVENOUS
  Filled 2020-01-12: qty 31

## 2020-01-12 MED ORDER — TECHNETIUM TC 99M TETROFOSMIN IV KIT
10.0000 | PACK | Freq: Once | INTRAVENOUS | Status: AC | PRN
  Administered 2020-01-12: 14:00:00 10 via INTRAVENOUS
  Filled 2020-01-12: qty 10

## 2020-01-29 NOTE — Progress Notes (Signed)
This is a duplicate.

## 2020-01-31 ENCOUNTER — Ambulatory Visit: Payer: Medicare Other | Admitting: Physician Assistant

## 2020-02-02 ENCOUNTER — Telehealth: Payer: Self-pay | Admitting: *Deleted

## 2020-02-02 DIAGNOSIS — R229 Localized swelling, mass and lump, unspecified: Secondary | ICD-10-CM | POA: Diagnosis not present

## 2020-02-02 DIAGNOSIS — I10 Essential (primary) hypertension: Secondary | ICD-10-CM | POA: Diagnosis not present

## 2020-02-02 DIAGNOSIS — E1169 Type 2 diabetes mellitus with other specified complication: Secondary | ICD-10-CM | POA: Diagnosis not present

## 2020-02-02 DIAGNOSIS — R2689 Other abnormalities of gait and mobility: Secondary | ICD-10-CM | POA: Diagnosis not present

## 2020-02-02 DIAGNOSIS — M8580 Other specified disorders of bone density and structure, unspecified site: Secondary | ICD-10-CM | POA: Diagnosis not present

## 2020-02-02 DIAGNOSIS — H029 Unspecified disorder of eyelid: Secondary | ICD-10-CM | POA: Diagnosis not present

## 2020-02-02 DIAGNOSIS — N390 Urinary tract infection, site not specified: Secondary | ICD-10-CM | POA: Diagnosis not present

## 2020-02-02 DIAGNOSIS — Z0000 Encounter for general adult medical examination without abnormal findings: Secondary | ICD-10-CM | POA: Diagnosis not present

## 2020-02-02 NOTE — Telephone Encounter (Signed)
  Hello, Can you explain my test results? Am I ok? Do I have any blockage or anything I should be concerned about? Karen Blankenship  [employer] received from patient Called and discussed with daughter, ok per DPR Per Dr Shanon Rosser normal perfusion study

## 2020-02-05 ENCOUNTER — Encounter: Payer: Self-pay | Admitting: Neurology

## 2020-02-05 ENCOUNTER — Ambulatory Visit (INDEPENDENT_AMBULATORY_CARE_PROVIDER_SITE_OTHER): Payer: Medicare Other | Admitting: Neurology

## 2020-02-05 VITALS — BP 154/75 | HR 56 | Ht 60.0 in | Wt 166.0 lb

## 2020-02-05 DIAGNOSIS — F5104 Psychophysiologic insomnia: Secondary | ICD-10-CM

## 2020-02-05 DIAGNOSIS — I739 Peripheral vascular disease, unspecified: Secondary | ICD-10-CM

## 2020-02-05 DIAGNOSIS — R0683 Snoring: Secondary | ICD-10-CM | POA: Diagnosis not present

## 2020-02-05 DIAGNOSIS — G2581 Restless legs syndrome: Secondary | ICD-10-CM

## 2020-02-05 DIAGNOSIS — G629 Polyneuropathy, unspecified: Secondary | ICD-10-CM | POA: Diagnosis not present

## 2020-02-05 MED ORDER — TRAZODONE HCL 50 MG PO TABS: 25.0000 mg | ORAL_TABLET | Freq: Every evening | ORAL | 1 refills | Status: DC | PRN

## 2020-02-05 MED ORDER — AMITRIPTYLINE HCL 10 MG PO TABS: 10.0000 mg | ORAL_TABLET | Freq: Every day | ORAL | 3 refills | Status: DC

## 2020-02-05 NOTE — Patient Instructions (Addendum)
Insomnia Insomnia is a sleep disorder that makes it difficult to fall asleep or stay asleep. Insomnia can cause fatigue, low energy, difficulty concentrating, mood swings, and poor performance at work or school. There are three different ways to classify insomnia:  Difficulty falling asleep.  Difficulty staying asleep.  Waking up too early in the morning. Any type of insomnia can be long-term (chronic) or short-term (acute). Both are common. Short-term insomnia usually lasts for three months or less. Chronic insomnia occurs at least three times a week for longer than three months. What are the causes? Insomnia may be caused by another condition, situation, or substance, such as:  Anxiety.  Certain medicines.  Gastroesophageal reflux disease (GERD) or other gastrointestinal conditions.  Asthma or other breathing conditions.  Restless legs syndrome, sleep apnea, or other sleep disorders.  Chronic pain.  Menopause.  Stroke.  Abuse of alcohol, tobacco, or illegal drugs.  Mental health conditions, such as depression and Caffeine.   Amitriptyline tablets, 10 mg - at bedtime.  What is this medicine? AMITRIPTYLINE (a mee TRIP ti leen) is used to treat depression. This medicine may be used for other purposes; ask your health care provider or pharmacist if you have questions. COMMON BRAND NAME(S): Elavil, Vanatrip What should I tell my health care provider before I take this medicine? They need to know if you have any of these conditions:  an alcohol problem  asthma, difficulty breathing  bipolar disorder or schizophrenia  difficulty passing urine, prostate trouble  heart disease or previous heart attack  liver disease  over active thyroid  seizures  thoughts or plans of suicide, a previous suicide attempt, or family history of suicide attempt  an unusual or allergic reaction to amitriptyline, other medicines, foods, dyes, or preservatives  pregnant or trying to  get pregnant  breast-feeding How should I use this medicine? Take this medicine by mouth with a drink of water. Follow the directions on the prescription label. You can take the tablets with or without food. Take your medicine at regular intervals. Do not take it more often than directed. Do not stop taking this medicine suddenly except upon the advice of your doctor. Stopping this medicine too quickly may cause serious side effects or your condition may worsen. A special MedGuide will be given to you by the pharmacist with each prescription and refill. Be sure to read this information carefully each time. Talk to your pediatrician regarding the use of this medicine in children. Special care may be needed. Overdosage: If you think you have taken too much of this medicine contact a poison control center or emergency room at once. NOTE: This medicine is only for you. Do not share this medicine with others. What if I miss a dose? If you miss a dose, take it as soon as you can. If it is almost time for your next dose, take only that dose. Do not take double or extra doses. What may interact with this medicine? Do not take this medicine with any of the following medications:  arsenic trioxide  certain medicines used to regulate abnormal heartbeat or to treat other heart conditions  cisapride  droperidol  halofantrine  linezolid  MAOIs like Carbex, Eldepryl, Marplan, Nardil, and Parnate  methylene blue  other medicines for mental depression  phenothiazines like perphenazine, thioridazine and chlorpromazine  pimozide  probucol  procarbazine  sparfloxacin  St. John's Wort This medicine may also interact with the following medications:  atropine and related drugs like hyoscyamine, scopolamine, tolterodine  and others  barbiturate medicines for inducing sleep or treating seizures, like phenobarbital  cimetidine  disulfiram  ethchlorvynol  thyroid hormones such as  levothyroxine  ziprasidone This list may not describe all possible interactions. Give your health care provider a list of all the medicines, herbs, non-prescription drugs, or dietary supplements you use. Also tell them if you smoke, drink alcohol, or use illegal drugs. Some items may interact with your medicine. What should I watch for while using this medicine? Tell your doctor if your symptoms do not get better or if they get worse. Visit your doctor or health care professional for regular checks on your progress. Because it may take several weeks to see the full effects of this medicine, it is important to continue your treatment as prescribed by your doctor. Patients and their families should watch out for new or worsening thoughts of suicide or depression. Also watch out for sudden changes in feelings such as feeling anxious, agitated, panicky, irritable, hostile, aggressive, impulsive, severely restless, overly excited and hyperactive, or not being able to sleep. If this happens, especially at the beginning of treatment or after a change in dose, call your health care professional. Dennis Bast may get drowsy or dizzy. Do not drive, use machinery, or do anything that needs mental alertness until you know how this medicine affects you. Do not stand or sit up quickly, especially if you are an older patient. This reduces the risk of dizzy or fainting spells. Alcohol may interfere with the effect of this medicine. Avoid alcoholic drinks. Do not treat yourself for coughs, colds, or allergies without asking your doctor or health care professional for advice. Some ingredients can increase possible side effects. Your mouth may get dry. Chewing sugarless gum or sucking hard candy, and drinking plenty of water will help. Contact your doctor if the problem does not go away or is severe. This medicine may cause dry eyes and blurred vision. If you wear contact lenses you may feel some discomfort. Lubricating drops may  help. See your eye doctor if the problem does not go away or is severe. This medicine can cause constipation. Try to have a bowel movement at least every 2 to 3 days. If you do not have a bowel movement for 3 days, call your doctor or health care professional. This medicine can make you more sensitive to the sun. Keep out of the sun. If you cannot avoid being in the sun, wear protective clothing and use sunscreen. Do not use sun lamps or tanning beds/booths. What side effects may I notice from receiving this medicine? Side effects that you should report to your doctor or health care professional as soon as possible:  allergic reactions like skin rash, itching or hives, swelling of the face, lips, or tongue  anxious  breathing problems  changes in vision  confusion  elevated mood, decreased need for sleep, racing thoughts, impulsive behavior  eye pain  fast, irregular heartbeat  feeling faint or lightheaded, falls  feeling agitated, angry, or irritable  fever with increased sweating  hallucination, loss of contact with reality  seizures  stiff muscles  suicidal thoughts or other mood changes  tingling, pain, or numbness in the feet or hands  trouble passing urine or change in the amount of urine  trouble sleeping  unusually weak or tired  vomiting  yellowing of the eyes or skin Side effects that usually do not require medical attention (report to your doctor or health care professional if they continue or  are bothersome):  change in sex drive or performance  change in appetite or weight  constipation  dizziness  dry mouth  nausea  tired  tremors  upset stomach This list may not describe all possible side effects. Call your doctor for medical advice about side effects. You may report side effects to FDA at 1-800-FDA-1088. Where should I keep my medicine? Keep out of the reach of children. Store at room temperature between 20 and 25 degrees C (68 and  77 degrees F). Throw away any unused medicine after the expiration date. NOTE: This sheet is a summary. It may not cover all possible information. If you have questions about this medicine, talk to your doctor, pharmacist, or health care provider.  2020 Elsevier/Gold Standard (2018-06-07 13:04:32)    An overactive thyroid (hyperthyroidism). Sometimes, the cause of insomnia may not be known. What increases the risk? Risk factors for insomnia include:  Gender. Women are affected more often than men.  Age. Insomnia is more common as you get older.  Stress.  Lack of exercise.  Irregular work schedule or working night shifts.  Traveling between different time zones.  Certain medical and mental health conditions. What are the signs or symptoms? If you have insomnia, the main symptom is having trouble falling asleep or having trouble staying asleep. This may lead to other symptoms, such as:  Feeling fatigued or having low energy.  Feeling nervous about going to sleep.  Not feeling rested in the morning.  Having trouble concentrating.  Feeling irritable, anxious, or depressed. How is this diagnosed? This condition may be diagnosed based on:  Your symptoms and medical history. Your health care provider may ask about: ? Your sleep habits. ? Any medical conditions you have. ? Your mental health.  A physical exam. How is this treated? Treatment for insomnia depends on the cause. Treatment may focus on treating an underlying condition that is causing insomnia. Treatment may also include:  Medicines to help you sleep.  Counseling or therapy.  Lifestyle adjustments to help you sleep better. Follow these instructions at home: Eating and drinking   Limit or avoid alcohol, caffeinated beverages, and cigarettes, especially close to bedtime. These can disrupt your sleep.  Do not eat a large meal or eat spicy foods right before bedtime. This can lead to digestive discomfort  that can make it hard for you to sleep. Sleep habits   Keep a sleep diary to help you and your health care provider figure out what could be causing your insomnia. Write down: ? When you sleep. ? When you wake up during the night. ? How well you sleep. ? How rested you feel the next day. ? Any side effects of medicines you are taking. ? What you eat and drink.  Make your bedroom a dark, comfortable place where it is easy to fall asleep. ? Put up shades or blackout curtains to block light from outside. ? Use a white noise machine to block noise. ? Keep the temperature cool.  Limit screen use before bedtime. This includes: ? Watching TV. ? Using your smartphone, tablet, or computer.  Stick to a routine that includes going to bed and waking up at the same times every day and night. This can help you fall asleep faster. Consider making a quiet activity, such as reading, part of your nighttime routine.  Try to avoid taking naps during the day so that you sleep better at night.  Get out of bed if you are still awake  after 15 minutes of trying to sleep. Keep the lights down, but try reading or doing a quiet activity. When you feel sleepy, go back to bed. General instructions  Take over-the-counter and prescription medicines only as told by your health care provider.  Exercise regularly, as told by your health care provider. Avoid exercise starting several hours before bedtime.  Use relaxation techniques to manage stress. Ask your health care provider to suggest some techniques that may work well for you. These may include: ? Breathing exercises. ? Routines to release muscle tension. ? Visualizing peaceful scenes.  Make sure that you drive carefully. Avoid driving if you feel very sleepy.  Keep all follow-up visits as told by your health care provider. This is important. Contact a health care provider if:  You are tired throughout the day.  You have trouble in your daily routine  due to sleepiness.  You continue to have sleep problems, or your sleep problems get worse. Get help right away if:  You have serious thoughts about hurting yourself or someone else. If you ever feel like you may hurt yourself or others, or have thoughts about taking your own life, get help right away. You can go to your nearest emergency department or call:  Your local emergency services (911 in the [place]  A suicide crisis helpline, such as the [place] at 340 884 0333. This is open 24 hours a day. Summary  Insomnia is a sleep disorder that makes it difficult to fall asleep or stay asleep.  Insomnia can be long-term (chronic) or short-term (acute).  Treatment for insomnia depends on the cause. Treatment may focus on treating an underlying condition that is causing insomnia.  Keep a sleep diary to help you and your health care provider figure out what could be causing your insomnia. This information is not intended to replace advice given to you by your health care provider. Make sure you discuss any questions you have with your health care provider. Document Revised: 05/28/2017 Document Reviewed: 03/25/2017 Elsevier Patient Education  2020 [employer].

## 2020-02-05 NOTE — Progress Notes (Signed)
SLEEP MEDICINE CLINIC    Provider:  Larey Seat, MD  Primary Care Physician:  London Pepper, MD [address] [place] [address]     Referring Provider: Dr. Percival Spanish, MD         Chief Complaint according to patient   Patient presents with:    . New Patient (Initial Visit)     pt with daughter, rm 30. presents today for having problems with falling asleep. averages about 3 hrs of sleep a day. some days has complaints of feeling very tired.       HISTORY OF PRESENT ILLNESS:  Karen Blankenship is a 86 y.o. year old Other or two or more races female patient seen here as a referral on 02/05/2020 from Dr hochrein for a sleep consultation.  Chief concern according to patient :  pt with daughter, Dr. Cristi Loron, in  rm 10. presents today for having problems with falling asleep,  averaging about 3 hours of sleep a day and complains of feeling very tired.   I have the pleasure of seeing Karen Blankenship today, a right -handed female of Karen Blankenship with a possible sleep disorder.  She has a past medical history of  Acquired diabetic polyneuropathy, Aortic atherosclerosis (HCC), Aortic root dilation (HCC), BMI 30.0-30.9,adult, Chronic insomnia ([place]), Frequent UTI, GERD (gastroesophageal reflux disease), Glaucoma, Cataract, Hypercholesterolemia, Hypertension, Hyperthyroidism, Loss of memory,  Osteoporosis, RLS (restless legs syndrome), and Subclavian artery stenosis ([place]).     Sleep relevant medical history: sleep changed 6 years ago, after her husband passed away- urinary frequency,  Reversed her nocturia- GERD- Family medical /sleep history:no other family member with OSA.  Social history:  Patient is retired from Administrator-  and lives in a household with her daughter and her partner.  Family status is widowed, with 2 adult daughters.   Tobacco use; none.  ETOH use ; none ,  Caffeine intake in form of Coffee( 1 cup in AM ) Soda( rare ) Tea ( none ) no energy  drinks. Regular exercise in form of walking, balance problems, knee pain, foot neuropathy. .     Sleep habits are as follows: The patient's dinner time is between 5-6 PM.  The patient goes to bed at 12.30 AM and some days sleeps right away- and continues to sleep for 2-4 hours, wakes for one bathroom break.  She looks at the clock a lot. She is anxious.   The preferred sleep position is laterally, with the support of 2  Soft pillows.  Dreams are reportedly frequent/vivid.  6.30 AM is the usual rise time. The patient wakes up spontaneously.  She reports not feeling refreshed or restored in AM, with symptoms such as dry mouth, morning headaches, and residual fatigue. Naps are not taken.    Out of a complete 14 system review, the patient complains of only the following symptoms, and all other reviewed systems are negative.:  Fatigue, vivid dreams, snoring, fragmented sleep, ever since her husband 's death 6 years ago-  Insomnia - waking up much too early, can't get back to sleep-   RLS - delayed sleep onset, for about 10 years    How likely are you to doze in the following situations: 0 = not likely, 1 = slight chance, 2 = moderate chance, 3 = high chance   Sitting and Reading? Watching Television? Sitting inactive in a public place (theater or meeting)? As a passenger in a car for an hour without a  break? Lying down in the afternoon when circumstances permit? Sitting and talking to someone? Sitting quietly after lunch without alcohol? In a car, while stopped for a few minutes in traffic?   Total =  13/ 24 points   FSS endorsed at 39/ 63 points.   Social History   Socioeconomic History  . Marital status: Widowed    Spouse name: Not on file  . Number of children: Not on file  . Years of education: Not on file  . Highest education level: Not on file  Occupational History  . Not on file  Tobacco Use  . Smoking status: Never Smoker  . Smokeless tobacco: Never Used  Substance  and Sexual Activity  . Alcohol use: No  . Drug use: No  . Sexual activity: Not on file  Other Topics Concern  . Not on file  Social History Narrative   Lives with daughter.  Two children.     Social Determinants of Health   Financial Resource Strain:   . Difficulty of Paying Living Expenses:   Food Insecurity:   . Worried About Charity fundraiser in the Last Year:   . Arboriculturist in the Last Year:   Transportation Needs:   . Film/video editor (Medical):   Marland Kitchen Lack of Transportation (Non-Medical):   Physical Activity:   . Days of Exercise per Week:   . Minutes of Exercise per Session:   Stress:   . Feeling of Stress :   Social Connections:   . Frequency of Communication with Friends and Family:   . Frequency of Social Gatherings with Friends and Family:   . Attends Religious Services:   . Active Member of Clubs or Organizations:   . Attends Archivist Meetings:   Marland Kitchen Marital Status:     Family History  Problem Relation Age of Onset  . Heart attack Mother        Died age 54  . Diabetes Father        Died in his 83s    Past Medical History:  Diagnosis Date  . Acid reflux   . Acquired polyneuropathy   . Aortic atherosclerosis ([place])   . Aortic root dilation (HCC)   . BMI 30.0-30.9,adult   . Chronic insomnia   . Diabetic neuropathy ([place])   . DM (diabetes mellitus) ([place])   . Frequent UTI   . GERD (gastroesophageal reflux disease)   . Glaucoma   . Hypercholesterolemia   . Hypertension   . Hyperthyroidism   . Loss of memory   . Osteopenia   . Osteoporosis   . RLS (restless legs syndrome)   . Subclavian artery stenosis [facility])     Past Surgical History:  Procedure Laterality Date  . ABDOMINAL HYSTERECTOMY    . BLADDER SUSPENSION    . CYST REMOVAL TRUNK    . KNEE SURGERY       Current Outpatient Medications on File Prior to Visit  Medication Sig Dispense Refill  . alendronate (FOSAMAX) 70 MG tablet Take 70 mg by mouth once a week. Take with  a full glass of water on an empty stomach.    Marland Kitchen aspirin 81 MG chewable tablet Chew 81 mg by mouth daily.    . brimonidine (ALPHAGAN) 0.2 % ophthalmic solution 3 (three) times daily.    Marland Kitchen gabapentin (NEURONTIN) 300 MG capsule Take 300 mg by mouth 3 (three) times daily. Patient takes one pill every 8 hours.    Marland Kitchen ibuprofen (  ADVIL,MOTRIN) 600 MG tablet Take 600 mg by mouth every 6 (six) hours as needed for pain.    Marland Kitchen lisinopril-hydrochlorothiazide (ZESTORETIC) 20-12.5 MG tablet Take 1 tablet by mouth daily.    . Omega-3 Fatty Acids (FISH OIL) 500 MG CAPS Take by mouth.    Marland Kitchen omeprazole (PRILOSEC) 20 MG capsule Take 20 mg by mouth daily.    . Timolol Maleate PF 0.25 % SOLN Apply to eye.     No current facility-administered medications on file prior to visit.    Allergies  Allergen Reactions  . Quinine Derivatives Anaphylaxis  . Sulfa Antibiotics Anaphylaxis  . Tramadol Itching    Physical exam:  Today's Vitals   02/05/20 1111  BP: (!) 154/75  Pulse: (!) 56  Weight: 166 lb (75.3 kg)  Height: 5' (1.524 m)   Body mass index is 32.42 kg/m.   Wt Readings from Last 3 Encounters:  02/05/20 166 lb (75.3 kg)  01/12/20 165 lb (74.8 kg)  12/29/19 165 lb 3.2 oz (74.9 kg)     Ht Readings from Last 3 Encounters:  02/05/20 5' (1.524 m)  01/12/20 5' (1.524 m)  12/29/19 5' (1.524 m)      General: The patient is awake, alert and appears not in acute distress. The patient is well groomed. Head: Normocephalic, atraumatic. Neck is supple. Mallampati 2- ,  Very low hanging soft palate  neck circumference: 16 inches . Nasal airflow congested - Retrognathia is not seen.   Cardiovascular:  Regular rate and cardiac rhythm by pulse,  without distended neck veins. Respiratory: Lungs are clear to auscultation.  Skin:  Without evidence of ankle edema, or rash. Trunk: The patient's posture is erect.   Neurologic exam : The patient is awake and alert, oriented to place and time.   Memory subjective  described as intact.  Attention span & concentration ability appears normal.  Speech is fluent,  without  dysarthria, dysphonia or aphasia.  Mood and affect are appropriate.   Cranial nerves: no loss of smell or taste reported  Pupils are equal and briskly reactive to light. Funduscopic exam - status post v cataract and bilaterally glaucoma changes. .  Extraocular movements in vertical and horizontal planes were intact and without nystagmus.  No Diplopia. Visual fields by finger perimetry are intact. Hearing was intact to soft voice and finger rubbing. Facial sensation intact to fine touch.  Facial motor strength is symmetric and tongue and uvula move midline.  Neck ROM : rotation, tilt and flexion extension were normal for age and shoulder shrug was symmetrical.    Motor exam:  Symmetric bulk, tone and ROM.   Normal tone without cog- wheeling, symmetric grip strength .   Sensory:  Fine touch,and vibration were reduced over the feet. Ascending  slowly.  She felt pressure not temperature, either. Proprioception tested in the upper extremities was normal.   Coordination: Rapid alternating movements in the fingers/hands were of normal speed.  The Finger-to-nose maneuver was intact without evidence of ataxia, dysmetria or tremor.  Gait and station: Patient could rise unassisted from a seated position, walked with a cane as assistive device.  Stance is of normal width/ base and the patient turned with 4  steps.  Toe and heel walk were deferred.  Deep tendon reflexes: in the upper and lower extremities are symmetric and intact.  Babinski response was deferred .       After spending a total time of 50 minutes face to face and additional time for physical  and neurologic examination, review of laboratory studies,  personal review of imaging studies, reports and results of other testing and review of referral information / records as far as provided in visit, I have established the following  assessments:  1)  chronic insomnia with late bedtime and early morning arousals.  Melatonin has failed. Ambien was not well tolerated.  Patient became tearful when she spoke about her husbands passing 6 years ago- reportedly , this was onset time of Insomnia.  2)  Loud snoring, but no witnessed or reported gasping, apnea.  3)  Rare nocturia. 4)  RLS improved with neuropathy treatment with Gabapentin.     My Plan is to proceed with:  1) attended sleep study , SPLIT night - with a spanish speaker or HST at home.  2) I had ordered  trazodone 25 mg each evening at 10.30 PM by mouth. This medication interfered with glaucoma, and I changed to elavil   3) sleep hygiene -No TV in bed , turn the clocks around. Please, read in bed if you can.    I would like to thank  Dr. Percival Spanish, MD, and London Pepper, Md [address] [place],  [place] [address] for allowing me to meet with and to take care of this pleasant patient.   In short, Karen Blankenship is presenting with delayed grief reaction insomnia.  I plan to follow up either personally or through our NP within 2-3 month.    Electronically signed by: Larey Seat, MD 02/05/2020 11:40 AM  [facility] Board certified by The [employer] of Sleep Medicine and Diplomate of the [employer] of Sleep Medicine. Board certified In Neurology through the [place], Fellow of the [employer] of Neurology. Medical Director of [facility].

## 2020-02-07 ENCOUNTER — Other Ambulatory Visit: Payer: Self-pay | Admitting: Family Medicine

## 2020-02-07 DIAGNOSIS — M8580 Other specified disorders of bone density and structure, unspecified site: Secondary | ICD-10-CM

## 2020-02-12 DIAGNOSIS — Z23 Encounter for immunization: Secondary | ICD-10-CM | POA: Diagnosis not present

## 2020-02-13 ENCOUNTER — Telehealth: Payer: Self-pay

## 2020-02-13 NOTE — Telephone Encounter (Signed)
LVM for pt to call me back to schedule sleep study  

## 2020-02-26 ENCOUNTER — Ambulatory Visit (INDEPENDENT_AMBULATORY_CARE_PROVIDER_SITE_OTHER): Payer: Medicare Other | Admitting: Neurology

## 2020-02-26 DIAGNOSIS — G4731 Primary central sleep apnea: Secondary | ICD-10-CM | POA: Diagnosis not present

## 2020-02-26 DIAGNOSIS — G4734 Idiopathic sleep related nonobstructive alveolar hypoventilation: Secondary | ICD-10-CM

## 2020-02-26 DIAGNOSIS — G629 Polyneuropathy, unspecified: Secondary | ICD-10-CM

## 2020-02-26 DIAGNOSIS — I739 Peripheral vascular disease, unspecified: Secondary | ICD-10-CM

## 2020-02-26 DIAGNOSIS — F5104 Psychophysiologic insomnia: Secondary | ICD-10-CM

## 2020-02-26 DIAGNOSIS — I77819 Aortic ectasia, unspecified site: Secondary | ICD-10-CM

## 2020-02-26 DIAGNOSIS — R0683 Snoring: Secondary | ICD-10-CM

## 2020-02-26 DIAGNOSIS — G2581 Restless legs syndrome: Secondary | ICD-10-CM

## 2020-03-01 DIAGNOSIS — H10531 Contact blepharoconjunctivitis, right eye: Secondary | ICD-10-CM | POA: Diagnosis not present

## 2020-03-01 DIAGNOSIS — H47232 Glaucomatous optic atrophy, left eye: Secondary | ICD-10-CM | POA: Diagnosis not present

## 2020-03-08 DIAGNOSIS — G4731 Primary central sleep apnea: Secondary | ICD-10-CM | POA: Insufficient documentation

## 2020-03-08 DIAGNOSIS — G4734 Idiopathic sleep related nonobstructive alveolar hypoventilation: Secondary | ICD-10-CM | POA: Insufficient documentation

## 2020-03-08 MED ORDER — ALPRAZOLAM 0.25 MG PO TABS: ORAL_TABLET | ORAL | 0 refills | Status: DC

## 2020-03-08 NOTE — Progress Notes (Signed)
IMPRESSION: 1. Severe degree of Complex, but mostly Obstructive Sleep Apnea (OSA) at AHI of 60.3/h and associated with prolonged and severe hypoxemia during sleep. Nadir SpO2 was 72 and duration of sleep related hypoxia was 149 minutes, 2.5 hours of sleep time.  2. Clinically insignificant degree of Periodic Limb Movement Disorder (PLMD).   RECOMMENDATIONS: Advise urgent full night, attended, PAP titration study to optimize therapy. The patient presents with Complex apnea, 100 obstructive and 98 central and mixed apneas- and is at high risk for treatment emergent central event. Only during an attended study can we intervene with oxygen should the 02 nadir not respond to PAP therapy.   PS:I have ordered a small dose of Xanax to help with insomnia during the CPAP titration.

## 2020-03-08 NOTE — Addendum Note (Signed)
Addended by: Larey Seat on: 03/08/2020 01:58 PM   Modules accepted: Orders

## 2020-03-08 NOTE — Procedures (Signed)
PATIENT'S NAME:  Karen Blankenship, Karen Blankenship DOB:      1934/01/07      MR#:    [id number]     DATE OF RECORDING: 02/26/2020 REFERRING M.D.:  Minus Breeding, MD Study Performed:   Baseline Polysomnogram HISTORY:  Karen Blankenship is an 86 year- old female patient seen here in a sleep consultation on 02/05/2020 upon referral from Dr Percival Spanish .  Chief concern; The patient is accompanied by her daughter, Dr. Cristi Loron, [place], and presents today for having problems with falling asleep, only averaging about 3 hours of sleep a day and feeling very tired.   I have the pleasure of seeing Karen Blankenship today, a right -handed female of [place] who has a past medical history of acquired diabetic polyneuropathy, Aortic atherosclerosis ([place]), Aortic root dilation ([place]), BMI 30.0-30.9, Chronic insomnia ([place]), Frequent UTI, GERD (gastroesophageal reflux disease), Glaucoma, Cataract, Hypercholesterolemia, Hypertension, Hyperthyroidism, impaired memory, Osteoporosis, RLS (restless legs syndrome), and Subclavian artery stenosis ([place]).    Sleep relevant history: her sleep quality and duration changed 6 years ago, after her husband passed away- she developed high urinary frequency, meanwhile successfully treated - and GERD. Patient is widowed and lives in a household with one of her two daughter and her partner.    Regular exercise in form of walking, balance problems, knee pain, foot neuropathy.     The patient goes to bed at 12.30 AM and some days sleeps right away- and continues to sleep for 2-4 hours, wakes for bathroom breaks.  She looks at the clock a lot. She is anxious.   Dreams are reportedly frequent/vivid.  6.30 AM is the usual rise time. The patient wakes up spontaneously.  She reports not feeling refreshed or restored in AM, with symptoms such as dry mouth, morning headaches, and residual fatigue. Naps are not taken.   ROS: Fatigue, vivid dreams, snoring, fragmented sleep, ever since her husband 's death 6 years ago-   Insomnia - waking up much too early, can't get back to sleep. The patient endorsed the Epworth Sleepiness Scale at 13/24 points.   The patient's weight 166 pounds with a height of 60 (inches), resulting in a BMI of 32.5 kg/m2. The patient's neck circumference measured 16 inches.  CURRENT MEDICATIONS: Fosamax, Aspirin, Alphagan, Neurontin, Advil, Zestoretic, Omega-3, Prilosec.   PROCEDURE:  This is a multichannel digital polysomnogram utilizing the Somnostar 11.2 system.  Electrodes and sensors were applied and monitored per AASM Specifications.   EEG, EOG, Chin and Limb EMG, were sampled at 200 Hz.  ECG, Snore and Nasal Pressure, Thermal Airflow, Respiratory Effort, CPAP Flow and Pressure, Oximetry was sampled at 50 Hz. Digital video and audio were recorded.      BASELINE STUDY: Lights Out was at 22:00 and Lights On at 04:56.  Total recording time (TRT) was 417 minutes, with a total sleep time (TST) of 328.5 minutes ( 5.5 hours).   The patient's sleep latency was 67 minutes.  REM latency was 148.5 minutes.  The sleep efficiency was 78.8 %.     SLEEP ARCHITECTURE: WASO (Wake after sleep onset) was 59 minutes.  There were 5.5 minutes in Stage N1, 261 minutes Stage N2, 23 minutes Stage N3 and 39 minutes in Stage REM.  The percentage of Stage N1 was 1.7%, Stage N2 was 79.5%, Stage N3 was 7.% and Stage R (REM sleep) was 11.9%.   RESPIRATORY ANALYSIS:  There were a total of 330 respiratory events:  100 obstructive apneas, 84 central apneas and 14 mixed apneas  with a total of 198 apneas and an apnea index (AI) of 36.2 /hour. There were 132 hypopneas with a hypopnea index of 24.1 /hour.      The total APNEA/HYPOPNEA INDEX (AHI) was 60.3/hour and the total RESPIRATORY DISTURBANCE INDEX was  60.3 /hour.  38 events occurred in REM sleep and 338 events in NREM. The REM AHI was  58.5 /hour, versus a non-REM AHI of 60.5. The patient spent 68 minutes of total sleep time in the supine position and 261 minutes in  non-supine. The supine AHI was 73.2 versus a non-supine AHI of 56.8.  OXYGEN SATURATION & C02:  The Wake baseline 02 saturation was 91%, with the lowest being 72%. Time spent below 89% saturation equaled 149 minutes.  The arousals were noted as: 40 were spontaneous, 5 were associated with PLMs, 38 were associated with respiratory events. The patient had a total of 28 Periodic Limb Movements.  The Periodic Limb Movement (PLM Arousal index was 0.9/hour. Audio and video analysis did not show any abnormal or unusual movements, behaviors, phonations or vocalizations.  The patient took one bathroom break. EKG was in regular sinus rhythm (NSR).  IMPRESSION: 1. Severe degree of Complex, but mostly Obstructive Sleep Apnea (OSA) at AHI of 60.3/h and associated with prolonged and severe hypoxemia during sleep. Nadir SpO2 was 72 and duration of sleep related hypoxia was 149 minutes, 2.5 hours of sleep time.  2. Clinically insignificant degree of Periodic Limb Movement Disorder (PLMD).   RECOMMENDATIONS: Advise urgent full night, attended, PAP titration study to optimize therapy. The patient presents with Complex apnea, 100 obstructive and 98 central and mixed apneas- and is at high risk for treatment emergent central event. Only during an attended study can we intervene with oxygen should the 02 nadir not respond to PAP therapy.    I certify that I have reviewed the entire raw data recording prior to the issuance of this report in accordance with the Standards of Accreditation of the American Academy of Sleep Medicine (AASM)    Larey Seat, MD Diplomat, American Board of Psychiatry and Neurology  Diplomat, American Board of Sleep Medicine Market researcher, [place] [facility] [employer]

## 2020-03-11 ENCOUNTER — Telehealth: Payer: Self-pay

## 2020-03-11 NOTE — Telephone Encounter (Signed)
I called pt, spoke with pt's daughter [name], per DPR. I advised pt that Dr. Brett Fairy reviewed their sleep study results and found that has severe sleep apnea with hypoxemia and recommends that pt be treated with a cpap. Dr. Brett Fairy recommends that pt return for a repeat sleep study in order to properly titrate the cpap and ensure a good mask fit. Pt is agreeable to returning for a titration study. I advised pt that our sleep lab will file with pt's insurance and call pt to schedule the sleep study when we hear back from the pt's insurance regarding coverage of this sleep study. I advised her that a small dose of xanax was called in to help with insomnia for the titration study. Pt verbalized understanding of results. Pt had no questions at this time but was encouraged to call back if questions arise.

## 2020-03-15 DIAGNOSIS — H401123 Primary open-angle glaucoma, left eye, severe stage: Secondary | ICD-10-CM | POA: Diagnosis not present

## 2020-03-15 DIAGNOSIS — H524 Presbyopia: Secondary | ICD-10-CM | POA: Diagnosis not present

## 2020-03-15 DIAGNOSIS — H10531 Contact blepharoconjunctivitis, right eye: Secondary | ICD-10-CM | POA: Diagnosis not present

## 2020-03-15 DIAGNOSIS — H401112 Primary open-angle glaucoma, right eye, moderate stage: Secondary | ICD-10-CM | POA: Diagnosis not present

## 2020-03-18 ENCOUNTER — Ambulatory Visit (INDEPENDENT_AMBULATORY_CARE_PROVIDER_SITE_OTHER): Payer: Medicare Other | Admitting: Neurology

## 2020-03-18 DIAGNOSIS — G4731 Primary central sleep apnea: Secondary | ICD-10-CM

## 2020-03-18 DIAGNOSIS — G4734 Idiopathic sleep related nonobstructive alveolar hypoventilation: Secondary | ICD-10-CM

## 2020-03-18 DIAGNOSIS — I77819 Aortic ectasia, unspecified site: Secondary | ICD-10-CM

## 2020-03-18 DIAGNOSIS — I739 Peripheral vascular disease, unspecified: Secondary | ICD-10-CM

## 2020-03-25 DIAGNOSIS — Z23 Encounter for immunization: Secondary | ICD-10-CM | POA: Diagnosis not present

## 2020-03-26 ENCOUNTER — Ambulatory Visit: Payer: Medicare Other | Admitting: Physical Therapy

## 2020-03-28 ENCOUNTER — Telehealth: Payer: Self-pay | Admitting: Neurology

## 2020-03-28 NOTE — Telephone Encounter (Signed)
-----   Message from Larey Seat, MD sent at 03/28/2020 12:50 PM EDT ----- DIAGNOSIS  Severe, Complex Sleep Apnea was controlled under CPAP of 17 cm  water pressure using a ResMed Airfit N30 full face mask.   PLANS/RECOMMENDATIONS: We will order an autotitration capable  CPAP device with 7-18 cm water pressure and 1 cm EPR, under  heated humidification. These high CPAP pressure may not be  comfortable for the patient, and if central apneas arise , we  will need to change to BiPAP.   DISCUSSION: A follow up appointment will be scheduled in the  Sleep Clinic at [facility].  Please call  [phone] with any questions.

## 2020-03-28 NOTE — Telephone Encounter (Signed)
Called patient to discuss sleep study results. No answer at this time. LVM for the patient to call back.   

## 2020-03-28 NOTE — Addendum Note (Signed)
Addended by: Larey Seat on: 03/28/2020 12:50 PM   Modules accepted: Orders

## 2020-03-28 NOTE — Progress Notes (Signed)
DIAGNOSIS  Severe, Complex Sleep Apnea was controlled under CPAP of 17 cm  water pressure using a ResMed Airfit N30 full face mask.   PLANS/RECOMMENDATIONS: We will order an autotitration capable  CPAP device with 7-18 cm water pressure and 1 cm EPR, under  heated humidification. These high CPAP pressure may not be  comfortable for the patient, and if central apneas arise , we  will need to change to BiPAP.   DISCUSSION: A follow up appointment will be scheduled in the  Sleep Clinic at [facility].  Please call  [phone] with any questions.

## 2020-03-28 NOTE — Procedures (Signed)
PATIENT'S NAME:  Karen Blankenship, Karen Blankenship DOB:      Dec 13, 1933      MR#:    [id number]     DATE OF RECORDING: 03/18/2020 CGA REFERRING M.D.:  Minus Breeding, MD Study Performed:   CPAP  Titration  The patient returned after undergoing a PSG on 02-26-2020 with the following diagnosis: Severe degree of Complex, but mostly Obstructive Sleep Apnea (OSA) at AHI of 60.3/h and associated with prolonged and severe hypoxemia during sleep. Nadir SpO2 was 72 and duration of sleep related hypoxia was 149 minutes, 2.5 hours of sleep time. Clinically insignificant degree of Periodic Limb Movement Disorder (PLMD).  The patient presents with Complex apnea, 100 obstructive and 98 central and mixed apneas- and is at high risk for treatment emergent central event. Only during an attended study can we intervene with oxygen should the 02 nadir not respond to PAP therapy.  The patient endorsed the Epworth Sleepiness Scale at 13/24 points.   The patient's weight 166 pounds with a height of 60 (inches), resulting in a BMI of 32.5 kg/m2.  The patient's neck circumference measured 16 inches.  CURRENT MEDICATIONS: Fosamax, Aspirin, Alphagan, Neurontin, Advil, Zestoretic, Omega-3, Prilosec.    PROCEDURE:  This is a multichannel digital polysomnogram utilizing the SomnoStar 11.2 system.  Electrodes and sensors were applied and monitored per AASM Specifications.   EEG, EOG, Chin and Limb EMG, were sampled at 200 Hz.  ECG, Snore and Nasal Pressure, Thermal Airflow, Respiratory Effort, CPAP Flow and Pressure, Oximetry was sampled at 50 Hz. Digital video and audio were recorded.       CPAP was initiated at 5 cmH20 with heated humidity per AASM split night standards and pressure was advanced to 17 cmH20 because of hypopneas, apneas and desaturations.  At a PAP pressure of 17 cmH20, there was a reduction of the AHI to 0.0 with improvement of sleep apnea. The patient was neither in REM sleep nor supine sleep position while reaching this  resolution of apnea. She slept 39 minutes, using a RESMED F30i medium FFM-.   Lights Out was at 21:29 and Lights On at 05:00. Total recording time (TRT) was 452 minutes, with a total sleep time (TST) of 330 minutes. The patient's sleep latency was 56 minutes. REM latency was 127.5 minutes.  The sleep efficiency was 73. %.    SLEEP ARCHITECTURE: WASO (Wake after sleep onset) was 94.5 minutes.  There were 8 minutes in Stage N1, 203 minutes Stage N2, 77.5 minutes Stage N3 and 41.5 minutes in Stage REM.  The percentage of Stage N1 was 2.4%, Stage N2 was 61.5%, Stage N3 was 23.5% and Stage R (REM sleep) was 12.6%.      RESPIRATORY ANALYSIS:  There was a total of 86 respiratory events: 43 obstructive apneas, 17 central apneas and 4 mixed apneas with a total of 64 apneas and an apnea index (AHI) of 11.6 /hour. There were 22 hypopneas with a hypopnea index of 4/hour. The patient also had 0 respiratory event related arousals (RERAs).      The total APNEA/HYPOPNEA INDEX  (AHI) was 15.6 /hour and the total RESPIRATORY DISTURBANCE INDEX was 15.6 /hour  34 events occurred in REM sleep and 52 events in NREM. The REM AHI was 49.2 /hour versus a non-REM AHI of 10.8 /hour.  The patient spent 139 minutes of total sleep time in the supine position and 191 minutes in non-supine. The supine AHI was 12.1, versus a non-supine AHI of 18.2.  OXYGEN SATURATION & C02:  The baseline 02 saturation was 92%, with the lowest being 86%. Time spent below 89% saturation equaled 5 minutes.  The arousals were noted as: 26 were spontaneous, 0 were associated with PLMs, 14 were associated with respiratory events. The patient had a total of 0 Periodic Limb Movements Audio and video analysis did not show any abnormal or unusual movements, behaviors, phonations or vocalizations.  EKG was in keeping with normal sinus rhythm.   DIAGNOSIS Severe, Complex Sleep Apnea was controlled under CPAP of 17 cm water pressure using a ResMed Airfit  N30 full face mask.  PLANS/RECOMMENDATIONS: We will order an autotitration capable CPAP device with 7-18 cm water pressure and 1 cm EPR, under heated humidification. These high CPAP pressure may not be comfortable for the patient, and if central apneas arise , we will need to change to BiPAP.   DISCUSSION: A follow up appointment will be scheduled in the Sleep Clinic at [facility].   Please call [phone] with any questions.      I certify that I have reviewed the entire raw data recording prior to the issuance of this report in accordance with the Standards of Accreditation of the American Academy of Sleep Medicine (AASM)  Larey Seat, M.D. Diplomat, Tax adviser of Psychiatry and Neurology  Diplomat, Tax adviser of Sleep Medicine Market researcher, [employer] [facility] at [employer]

## 2020-04-01 ENCOUNTER — Encounter: Payer: Medicare Other | Admitting: Physical Therapy

## 2020-04-02 NOTE — Telephone Encounter (Signed)
Pt daughter returned call. I advised pt that Dr. Brett Fairy reviewed their sleep study results and found that pt was best tolerated on CPAP at a pressure of 17 cm. Dr. Brett Fairy recommends that pt starts auto CPAP 7-18 cm water pressure. I reviewed PAP compliance expectations with the pt. Pt is agreeable to starting a CPAP. I advised pt that an order will be sent to a DME, [facility] and [facility] will call the pt within about one week after they file with the pt's insurance. [facility]) will show the pt how to use the machine, fit for masks, and troubleshoot the CPAP if needed. A follow up appt was made for insurance purposes with Ward Givens, NP on 07/08/20 at 10 am. Pt verbalized understanding to arrive 15 minutes early and bring their CPAP. A letter with all of this information in it will be mailed to the pt as a reminder. I verified with the pt that the address we have on file is correct. Pt verbalized understanding of results. Pt had no questions at this time but was encouraged to call back if questions arise. I have sent the order to [facility]) and have received confirmation that they have received the order.

## 2020-04-08 ENCOUNTER — Encounter: Payer: Medicare Other | Admitting: Physical Therapy

## 2020-04-16 ENCOUNTER — Other Ambulatory Visit: Payer: Self-pay

## 2020-04-16 ENCOUNTER — Encounter: Payer: Self-pay | Admitting: Physical Therapy

## 2020-04-16 ENCOUNTER — Ambulatory Visit: Payer: Medicare Other | Attending: Family Medicine | Admitting: Physical Therapy

## 2020-04-16 DIAGNOSIS — R2689 Other abnormalities of gait and mobility: Secondary | ICD-10-CM | POA: Diagnosis not present

## 2020-04-16 DIAGNOSIS — Z9181 History of falling: Secondary | ICD-10-CM

## 2020-04-16 DIAGNOSIS — M6281 Muscle weakness (generalized): Secondary | ICD-10-CM | POA: Diagnosis not present

## 2020-04-16 NOTE — Therapy (Signed)
[facility] [address] [place], [place], [address] Phone: [phone]   Fax:  [phone]  Physical Therapy Evaluation  Patient Details  Name: Karen Blankenship MRN: [id number] Date of Birth: [birth year] Referring Provider (PT): Dr. London Blankenship   Encounter Date: 04/16/2020   PT End of Session - 04/16/20 0950    Visit Number 1    Date for PT Re-Evaluation 07/09/20    Authorization Type Medicare    PT Start Time 0800    PT Stop Time 0840    PT Time Calculation (min) 40 min    Activity Tolerance Patient tolerated treatment well           Past Medical History:  Diagnosis Date  . Acid reflux   . Acquired polyneuropathy   . Aortic atherosclerosis ([place])   . Aortic root dilation ([facility])   . BMI 30.0-30.9,adult   . Chronic insomnia   . Diabetic neuropathy ([place])   . DM (diabetes mellitus) ([place])   . Frequent UTI   . GERD (gastroesophageal reflux disease)   . Glaucoma   . Hypercholesterolemia   . Hypertension   . Hyperthyroidism   . Loss of memory   . Osteopenia   . Osteoporosis   . RLS (restless legs syndrome)   . Subclavian artery stenosis [facility])     Past Surgical History:  Procedure Laterality Date  . ABDOMINAL HYSTERECTOMY    . BLADDER SUSPENSION    . CYST REMOVAL TRUNK    . KNEE SURGERY      There were no vitals filed for this visit.    Subjective Assessment - 04/16/20 0802    Subjective Not steady on feet.  Uses Cane for community not at home.   Fell in bedroom 4-5 months ago.    Patient is accompained by: Family member;Interpreter   Daughter Karen Blankenship interpreting   Pertinent History patient speaks Spanish,  goes by [employer]";  pt lives with and will be transported by her daughter, daughter will also be interpreting;  osteoporosis, OA left knee, HTN, glaucoma, PVD;  right TKR    Limitations House hold activities;Walking    How long can you walk comfortably? with cane 1 mile    Diagnostic tests none     Patient Stated Goals Be more steady and secure on my feet    Currently in Pain? Yes    Pain Score 6     Pain Location Knee    Pain Orientation Left    Pain Type Chronic pain    Pain Onset More than a month ago    Pain Frequency Intermittent    Aggravating Factors  sits in low seats              [facility] PT Assessment - 04/16/20 0001      Assessment   Medical Diagnosis decreased balance; LE weakness     Referring Provider (PT) Dr. London Blankenship    Onset Date/Surgical Date --   > 6 months   Next MD Visit goes regularly to check A1C     Prior Therapy for TKR and recently for knee pain      Precautions   Precautions Fall      Restrictions   Weight Bearing Restrictions No      Balance Screen   Has the patient fallen in the past 6 months Yes    How many times? 1    Has the patient had a decrease in activity level because of a  fear of falling?  Yes    Is the patient reluctant to leave their home because of a fear of falling?  No      Home Social worker Private residence    [place] to enter    Entrance Stairs-Number of Steps 3-4    Home Layout Able to live on main level with bedroom/bathroom    Home Equipment [place] - single point    Additional Comments husband deceased (have lots of equipment that he used to use)      Prior Function   Level of Independence Independent   cooks, laundry, changes sheets, cleans   Leisure gambling, shopping to send care packages , play with dog       Posture/Postural Control   Posture/Postural Control Postural limitations    Postural Limitations Rounded Shoulders;Forward head;Increased thoracic kyphosis      AROM   Overall AROM Comments UE/LEs WFLs      Strength   Overall Strength Comments Needs UE assist to rise from a standard chair     Right Hip Flexion 4-/5    Right Hip Extension 3+/5    Right Hip ABduction 3+/5    Left Hip Flexion 4-/5    Left Hip Extension 3+/5    Left  Hip ABduction 3+/5    Right Knee Flexion 4-/5    Right Knee Extension 4-/5    Left Knee Flexion 4-/5    Left Knee Extension 4-/5    Right Ankle Dorsiflexion 3+/5    Right Ankle Plantar Flexion 3+/5    Left Ankle Dorsiflexion 3+/5    Left Ankle Plantar Flexion 3+/5      Ambulation/Gait   Pre-Gait Activities using a SPC however daugther also providing CGA       Standardized Balance Assessment   Five times sit to stand comments  with UEs 33.64 sec       Berg Balance Test   Sit to Stand Able to stand  independently using hands    Standing Unsupported Able to stand 2 minutes with supervision    Sitting with Back Unsupported but Feet Supported on Floor or Stool Able to sit safely and securely 2 minutes    Stand to Sit Controls descent by using hands    Transfers Able to transfer safely, definite need of hands    Standing Unsupported with Eyes Closed Able to stand 10 seconds with supervision    Standing Unsupported with Feet Together Able to place feet together independently and stand for 1 minute with supervision    From Standing, Reach Forward with Outstretched Arm Can reach forward >12 cm safely (5")    From Standing Position, Pick up Object from Floor Able to pick up shoe, needs supervision    From Standing Position, Turn to Look Behind Over each Shoulder Looks behind from both sides and weight shifts well    Turn 360 Degrees Able to turn 360 degrees safely but slowly    Standing Unsupported, Alternately Place Feet on Step/Stool Able to complete >2 steps/needs minimal assist    Standing Unsupported, One Foot in Front Able to take small step independently and hold 30 seconds    Standing on One Leg Tries to lift leg/unable to hold 3 seconds but remains standing independently    Total Score 38      Timed Up and Go Test   Normal TUG (seconds) 14.29   with cane  Objective measurements completed on examination: See above findings.                PT Education - 04/16/20 0949    Education Details In Spanish:  sit to stand from higher chair, standing at counter heel raises; standing at counter hip abduction    Person(s) Educated Patient;Child(ren)    Methods Explanation;Demonstration;Handout    Comprehension Verbalized understanding;Returned demonstration            PT Short Term Goals - 04/16/20 1010      PT SHORT TERM GOAL #1   Title The patient and daughter will demonstrate compliance with initial HEP for LE strength and balance    Time 6    Period Weeks    Status New    Target Date 05/28/20      PT SHORT TERM GOAL #2   Title The patient will have improved BERG balance score to 40/56    Time 6    Period Weeks    Status New      PT SHORT TERM GOAL #3   Title Timed up and Go score improved to 13 sec indicating improved gait speed and safety    Time 6    Period Weeks    Status New      PT SHORT TERM GOAL #4   Title 5x sit to stand test (with UE assist) improved to 28 sec indicating improved LE strength    Time 6    Period Weeks    Status New             PT Long Term Goals - 04/16/20 1015      PT LONG TERM GOAL #1   Title The patient will demonstrate independence in safe self progression of HEP for further improvements in strength and balance    Time 12    Period Weeks    Status New    Target Date 07/09/20      PT LONG TERM GOAL #2   Title The patient will have improved bil LE strength to grossly 4/5 to 4+/5 needed for greater ease with sit to stand and negotiating curbs and steps    Time 12    Period Weeks    Status New      PT LONG TERM GOAL #3   Title The patient will have improved BERG balance test to 44/56 indicating improved gait safety and decreased risk of falls    Time 12    Period Weeks    Status New      PT LONG TERM GOAL #4   Title 5x sit to stand test (light UE use) time improved to 25 sec indicating improved mobility and strength    Time 12    Period Weeks     Status New      PT LONG TERM GOAL #5   Title Timed up and Go test 12.7 sec to be within normal range for age    Time 47    Period Weeks    Status New                  Plan - 04/16/20 0837    Clinical Impression Statement The patient presents with her daugther with whom she lives with and will act as an interpreter for her Spanish-speaking mother.  They report a history of unsteadiness on her feet for > 6 months with reports of 1 fall about 4-5 months ago.  At home she does not  use an assistive device and helps with the laundry, cleaning and cooking.  She uses a single point cane when she goes for a walk and when in the community.  At the store she pushes the shopping basket.  She has difficulty with curbs and steps (3-4 to enter/exit home).  Decreased LE strength bilaterally grossly 3+/5 to 4-/5.  She is unable to rise from a standard chair without significant UE use.  Her BERG score is 38/56 indicating a signficant risk for falls 80%.  Discussed the result with the patient/daughter and recommend a RW for safety at this time.  Decreased gait speed with Timed up and Go test 14.29 (norm for age is 12.7 sec).  She would benefit from PT to address these balance and strength deficits.    Personal Factors and Comorbidities Age;Comorbidity 1;Comorbidity 2;Comorbidity 3+;Other    Comorbidities osteoporosis; glaucoma; HTN; PVD, OA left knee; right knee TKR; language barrier    Examination-Activity Limitations Locomotion Level;Stand;Stairs;Carry    Examination-Participation Restrictions Meal Prep;Cleaning;Community Activity;Laundry;Shop    Stability/Clinical Decision Making Stable/Uncomplicated    Clinical Decision Making Low    Rehab Potential Good    PT Frequency 2x / week    PT Duration 12 weeks    PT Treatment/Interventions ADLs/Self Care Home Management;Aquatic Therapy;Gait training;Stair training;Functional mobility training;Therapeutic activities;Therapeutic exercise;Balance  training;Neuromuscular re-education;Manual techniques;Patient/family education    PT Next Visit Plan bil LE strengthening; do Dynamic Gait Index; balance: reaching, weight shifting, narrow base of support    PT Home Exercise Plan [email or web address] in Spanish    Consulted and Agree with Plan of Care Patient;Family member/caregiver    Family Member Consulted daughter           Patient will benefit from skilled therapeutic intervention in order to improve the following deficits and impairments:  Abnormal gait, Decreased coordination, Difficulty walking, Impaired perceived functional ability, Decreased strength  Visit Diagnosis: Other abnormalities of gait and mobility - Plan: PT plan of care cert/re-cert  History of falling - Plan: PT plan of care cert/re-cert  Muscle weakness (generalized) - Plan: PT plan of care cert/re-cert     Problem List Patient Active Problem List   Diagnosis Date Noted  . Complex sleep apnea syndrome 03/08/2020  . Chronic intermittent hypoxia with obstructive sleep apnea 03/08/2020  . RLS (restless legs syndrome) 02/05/2020  . Chronic insomnia 02/05/2020  . Loud snoring 02/05/2020  . Neuropathy 02/05/2020  . PVD (peripheral vascular disease) ([place]) 12/29/2019  . Decreased exercise tolerance 12/28/2019  . Educated about COVID-19 virus infection 12/28/2019  . HTN (hypertension), benign 09/25/2013  . Hyperlipidemia 09/25/2013  . Osteoarthritis, knee 09/25/2013   Ruben Im, PT 04/16/20 3:52 PM Phone: [phone] Fax: [phone] Alvera Singh 04/16/2020, 3:51 PM  [facility] [address], [place], [place], [address] Phone: [phone]   Fax:  [phone]  Name: Karen Blankenship MRN: [id number] Date of Birth: [birth year]

## 2020-04-16 NOTE — Telephone Encounter (Signed)
Daughter called and states they have not heard from [facility]. Order was sent on 03/28/20. Advised the daughter I would contact them and have them check on this.

## 2020-04-16 NOTE — Patient Instructions (Signed)
Access Code: [email or web address] URL: [email or web address] Date: 04/16/2020 Prepared by: Ruben Im  Exercises Sentado a parado con apoyo de encimera - 1 x daily - 7 x weekly - 2 sets - 5 reps Elevaci&oacute;n del tal&oacute;n con apoyo de pie - 1 x daily - 7 x weekly - 1 sets - 10 reps Abducci&oacute;n de cadera de pie con apoyo en encimera - 1 x daily - 7 x weekly - 2 sets - 5 reps

## 2020-04-18 NOTE — Telephone Encounter (Signed)
Called the daughter back. Didn't realized that the patient had an apt scheduled for 10/25. Called the daughter to advise that since the patient has not started the CPAP therapy this apt would need to be pushed out to fall within 31-90 days after starting the machine.   (The daughter also is scheduled to see the MD same day, unsure if that is needed as well)

## 2020-04-19 NOTE — Telephone Encounter (Signed)
Pt's daughter called and stated that she is ok with her mother's appt being cancelled but she will come in for her's. Daughter also states that she would like to know how the process can be expedited for her mother can get her cpap machine. Please advise.

## 2020-04-22 ENCOUNTER — Ambulatory Visit: Payer: Medicare Other | Admitting: Neurology

## 2020-04-22 NOTE — Telephone Encounter (Signed)
Apt cancelled for the patient today and will discuss with daughter at her visit in regards to getting the patient set up sooner.

## 2020-04-30 ENCOUNTER — Ambulatory Visit: Payer: Medicare Other | Attending: Family Medicine | Admitting: Physical Therapy

## 2020-04-30 ENCOUNTER — Other Ambulatory Visit: Payer: Self-pay

## 2020-04-30 DIAGNOSIS — R2689 Other abnormalities of gait and mobility: Secondary | ICD-10-CM | POA: Diagnosis not present

## 2020-04-30 DIAGNOSIS — M6281 Muscle weakness (generalized): Secondary | ICD-10-CM | POA: Insufficient documentation

## 2020-04-30 DIAGNOSIS — Z9181 History of falling: Secondary | ICD-10-CM | POA: Insufficient documentation

## 2020-04-30 NOTE — Patient Instructions (Signed)
Access Code: [email or web address] URL: [email or web address] Date: 04/30/2020 Prepared by: Ruben Im  Exercises Sit to Stand with Counter Support - 1 x daily - 7 x weekly - 2 sets - 5 reps Standing Heel Raise with Support - 1 x daily - 7 x weekly - 1 sets - 10 reps Standing Hip Abduction with Counter Support - 1 x daily - 7 x weekly - 2 sets - 5 reps Walking with Head Rotation - 1 x daily - 7 x weekly - 1 sets - 10 reps Side Step Overs with Cones and Unilateral Counter Support - 1 x daily - 7 x weekly - 1 sets - 10 reps Alternating Step Taps with Counter Support - 1 x daily - 7 x weekly - 1 sets - 10 reps Half Dead Lift with Kettlebell - 1 x daily - 7 x weekly - 1 sets - 10 reps

## 2020-04-30 NOTE — Therapy (Signed)
[facility] [address] [place], [place], [address] Phone: [phone]   Fax:  [phone]  Physical Therapy Treatment  Patient Details  Name: Karen Blankenship MRN: [id number] Date of Birth: 03/28/1934 Referring Provider (PT): Dr. London Pepper   Encounter Date: 04/30/2020   PT End of Session - 04/30/20 1749    Visit Number 2    Date for PT Re-Evaluation 07/09/20    Authorization Type Medicare    PT Start Time 1324    PT Stop Time 1527    PT Time Calculation (min) 42 min    Activity Tolerance Patient tolerated treatment well           Past Medical History:  Diagnosis Date  . Acid reflux   . Acquired polyneuropathy   . Aortic atherosclerosis ([place])   . Aortic root dilation (HCC)   . BMI 30.0-30.9,adult   . Chronic insomnia   . Diabetic neuropathy ([place])   . DM (diabetes mellitus) ([place])   . Frequent UTI   . GERD (gastroesophageal reflux disease)   . Glaucoma   . Hypercholesterolemia   . Hypertension   . Hyperthyroidism   . Loss of memory   . Osteopenia   . Osteoporosis   . RLS (restless legs syndrome)   . Subclavian artery stenosis [facility])     Past Surgical History:  Procedure Laterality Date  . ABDOMINAL HYSTERECTOMY    . BLADDER SUSPENSION    . CYST REMOVAL TRUNK    . KNEE SURGERY      There were no vitals filed for this visit.   Subjective Assessment - 04/30/20 1447    Subjective New shoes.  I've been doing my exercises.  No problems.  No falls.    Patient is accompained by: Family member;Interpreter   daughter   Pertinent History patient speaks Spanish,  goes by [employer]";  pt lives with and will be transported by her daughter, daughter will also be interpreting;  osteoporosis, OA left knee, HTN, glaucoma, PVD;  right TKR    Currently in Pain? No/denies    Pain Score 0-No pain    Pain Orientation Left              [facility] PT Assessment - 04/30/20 0001      Dynamic Gait Index   Level Surface  Normal    Change in Gait Speed Mild Impairment    Gait with Horizontal Head Turns Mild Impairment    Gait with Vertical Head Turns Mild Impairment    Gait and Pivot Turn Mild Impairment    Step Over Obstacle Severe Impairment    Step Around Obstacles Mild Impairment    Steps Mild Impairment    Total Score 15    DGI comment: inc risk of falls                          [facility] Adult PT Treatment/Exercise - 04/30/20 0001      Therapeutic Activites    Therapeutic Activities Lifting    Lifting 5# and 8# modified dead lift to knee level 10x each       Neuro Re-ed    Neuro Re-ed Details  head movements, negotiating obstacles while walking      Knee/Hip Exercises: Standing   Gait Training stepping over and back across yard stick 2 sets of 5 with 2 finger support     Other Standing Knee Exercises staggered standing with head turns right/left  next to counter with 2 finger support 2 sets of 5    Other Standing Knee Exercises step taps 10x single arm support       Knee/Hip Exercises: Seated   Other Seated Knee/Hip Exercises verbal review of previous HEP                  PT Education - 04/30/20 1748    Education Details In Spanish: staggered stand with head movements, stepping over objects, step taps; modified dead lift    Person(s) Educated Patient;Child(ren)    Methods Explanation;Demonstration;Handout    Comprehension Returned demonstration;Verbalized understanding            PT Short Term Goals - 04/16/20 1010      PT SHORT TERM GOAL #1   Title The patient and daughter will demonstrate compliance with initial HEP for LE strength and balance    Time 6    Period Weeks    Status New    Target Date 05/28/20      PT SHORT TERM GOAL #2   Title The patient will have improved BERG balance score to 40/56    Time 6    Period Weeks    Status New      PT SHORT TERM GOAL #3   Title Timed up and Go score improved to 13 sec indicating improved gait speed and  safety    Time 6    Period Weeks    Status New      PT SHORT TERM GOAL #4   Title 5x sit to stand test (with UE assist) improved to 28 sec indicating improved LE strength    Time 6    Period Weeks    Status New             PT Long Term Goals - 04/16/20 1015      PT LONG TERM GOAL #1   Title The patient will demonstrate independence in safe self progression of HEP for further improvements in strength and balance    Time 12    Period Weeks    Status New    Target Date 07/09/20      PT LONG TERM GOAL #2   Title The patient will have improved bil LE strength to grossly 4/5 to 4+/5 needed for greater ease with sit to stand and negotiating curbs and steps    Time 12    Period Weeks    Status New      PT LONG TERM GOAL #3   Title The patient will have improved BERG balance test to 44/56 indicating improved gait safety and decreased risk of falls    Time 12    Period Weeks    Status New      PT LONG TERM GOAL #4   Title 5x sit to stand test (light UE use) time improved to 25 sec indicating improved mobility and strength    Time 12    Period Weeks    Status New      PT LONG TERM GOAL #5   Title Timed up and Go test 12.7 sec to be within normal range for age    Time 61    Period Weeks    Status New                 Plan - 04/30/20 1458    Clinical Impression Statement The patient's daughter present to interpret and assist her mother with her HEP to improve her balance.  She reports she helped her mother the first few times but then she was able to do them by herself.  Performed Dynamic Gait Index with a score of 15/23 which indicates increased risk of falls.  She has significant difficulty turning her head while walking and stepping over objects on the floor.  Added to her HEP to include ex's to address these issues.  Instructed her to do them with her daughter's assist and next to her dining room table for safety.  Therapist providing CGA with all balance challenges.     Comorbidities osteoporosis; glaucoma; HTN; PVD, OA left knee; right knee TKR; language barrier    Examination-Participation Restrictions Meal Prep;Cleaning;Community Activity;Laundry;Shop    Rehab Potential Good    PT Frequency 2x / week    PT Duration 12 weeks    PT Treatment/Interventions ADLs/Self Care Home Management;Aquatic Therapy;Gait training;Stair training;Functional mobility training;Therapeutic activities;Therapeutic exercise;Balance training;Neuromuscular re-education;Manual techniques;Patient/family education    PT Next Visit Plan bil LE strengthening; review modified dead lifting with weight;  balance: reaching, weight shifting, narrow base of support    PT Home Exercise Plan [id number] Jin Spanish    Consulted and Agree with Plan of Care Patient;Family member/caregiver           Patient will benefit from skilled therapeutic intervention in order to improve the following deficits and impairments:  Abnormal gait, Decreased coordination, Difficulty walking, Impaired perceived functional ability, Decreased strength  Visit Diagnosis: Other abnormalities of gait and mobility  History of falling  Muscle weakness (generalized)     Problem List Patient Active Problem List   Diagnosis Date Noted  . Complex sleep apnea syndrome 03/08/2020  . Chronic intermittent hypoxia with obstructive sleep apnea 03/08/2020  . RLS (restless legs syndrome) 02/05/2020  . Chronic insomnia 02/05/2020  . Loud snoring 02/05/2020  . Neuropathy 02/05/2020  . PVD (peripheral vascular disease) ([place]) 12/29/2019  . Decreased exercise tolerance 12/28/2019  . Educated about COVID-19 virus infection 12/28/2019  . HTN (hypertension), benign 09/25/2013  . Hyperlipidemia 09/25/2013  . Osteoarthritis, knee 09/25/2013   Ruben Im, PT 04/30/20 5:55 PM Phone: [phone] Fax: [phone] Alvera Singh 04/30/2020, 5:55 PM  [facility] [address], [place], [place], [address] Phone: [phone]   Fax:  [phone]  Name: Amoreena Neubert MRN: [id number] Date of Birth: 1934/03/08

## 2020-05-06 ENCOUNTER — Ambulatory Visit
Admission: RE | Admit: 2020-05-06 | Discharge: 2020-05-06 | Disposition: A | Discharge status: Home / Self Care | Payer: Medicare Other | Source: Ambulatory Visit | Attending: Family Medicine | Admitting: Family Medicine

## 2020-05-06 ENCOUNTER — Other Ambulatory Visit: Payer: Self-pay

## 2020-05-06 DIAGNOSIS — M8580 Other specified disorders of bone density and structure, unspecified site: Secondary | ICD-10-CM

## 2020-05-26 DIAGNOSIS — S93491A Sprain of other ligament of right ankle, initial encounter: Secondary | ICD-10-CM | POA: Diagnosis not present

## 2020-06-03 ENCOUNTER — Ambulatory Visit: Payer: Medicare Other | Admitting: Physical Therapy

## 2020-06-10 ENCOUNTER — Encounter: Payer: Medicare Other | Admitting: Physical Therapy

## 2020-06-11 DIAGNOSIS — S93491D Sprain of other ligament of right ankle, subsequent encounter: Secondary | ICD-10-CM | POA: Diagnosis not present

## 2020-06-17 ENCOUNTER — Encounter: Payer: Medicare Other | Admitting: Physical Therapy

## 2020-06-24 ENCOUNTER — Ambulatory Visit: Payer: Medicare Other | Admitting: Physical Therapy

## 2020-07-02 ENCOUNTER — Encounter: Payer: Medicare Other | Admitting: Physical Therapy

## 2020-07-02 DIAGNOSIS — S93491D Sprain of other ligament of right ankle, subsequent encounter: Secondary | ICD-10-CM | POA: Diagnosis not present

## 2020-07-07 DIAGNOSIS — Z23 Encounter for immunization: Secondary | ICD-10-CM | POA: Diagnosis not present

## 2020-07-08 ENCOUNTER — Other Ambulatory Visit: Payer: Self-pay

## 2020-07-08 ENCOUNTER — Encounter: Payer: Self-pay | Admitting: Adult Health

## 2020-07-08 ENCOUNTER — Ambulatory Visit (INDEPENDENT_AMBULATORY_CARE_PROVIDER_SITE_OTHER): Payer: Medicare Other | Admitting: Adult Health

## 2020-07-08 VITALS — BP 155/76 | HR 64 | Ht 60.0 in | Wt 160.0 lb

## 2020-07-08 DIAGNOSIS — G4733 Obstructive sleep apnea (adult) (pediatric): Secondary | ICD-10-CM

## 2020-07-08 DIAGNOSIS — Z9989 Dependence on other enabling machines and devices: Secondary | ICD-10-CM | POA: Diagnosis not present

## 2020-07-08 NOTE — Progress Notes (Signed)
PATIENT: Karen Blankenship DOB: 11/17/1933  REASON FOR VISIT: follow up HISTORY FROM: patient  HISTORY OF PRESENT ILLNESS: Today 07/08/20:  Karen Blankenship is an 87 year old female with a history of obstructive sleep apnea on CPAP.  She returns today for follow-up.  Her download indicates that she use her machine nightly for compliance of 100%.  She used her machine greater than 4 hours 25 days for compliance of 83%.  On average she uses her machine 5 hours and 1 minute.  Her residual AHI is 18.1 on 718 cm of water with an EPR of 3.  Leak in the 95th percentile is 15.4 cm of water.  Leak in the 95th percentile is 35.7 L/min.  Apnea index revealed central was 3.1 and obstructive 3.1, unknown 9.2 the patient's daughter states that she recently changed mask.  When looking at the graph her events have decreased approximately 5 events an hour when she has a good seal.  HISTORY (Copied from Dr.Dohmeier's note) Karen Aguerois a 87 y.o. 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  Chiefconcernaccording to patient : pt with daughter, Dr. Cristi Loron, in  rm 10. presents today for having problems with falling asleep,  averaging about 3 hours of sleep a day and complains of feeling very tired.  I have the pleasure of seeing Burgandy Hackworth today,a right -handed female of [place] with a possible sleep disorder. She has a past medical history of  Acquired diabetic polyneuropathy, Aortic atherosclerosis (HCC), Aortic root dilation (HCC), BMI 30.0-30.9,adult, Chronic insomnia ([place]), Frequent UTI, GERD (gastroesophageal reflux disease), Glaucoma, Cataract, Hypercholesterolemia, Hypertension, Hyperthyroidism, Loss of memory,  Osteoporosis, RLS (restless legs syndrome), and Subclavian artery stenosis ([place]).   Sleeprelevant medical history: sleep changed 6 years ago, after her husband passed away- urinary frequency,  Reversed her  nocturia- GERD- Familymedical /sleep history:no other family member with OSA. Social history:Patient is retired from Administrator-  and lives in a household with her daughter and her partner.  Family status is widowed, with 2 adult daughters.   Tobacco use; none. ETOH use ; none ,  Caffeine intake in form of Coffee( 1 cup in AM ) Soda( rare ) Tea ( none ) no energy drinks. Regular exercise in form of walking, balance problems, knee pain, foot neuropathy. .    Sleep habits are as follows:The patient's dinner time is between 5-6 PM.  The patient goes to bed at 12.30 AM and some days sleeps right away- and continues to sleep for 2-4 hours, wakes for one bathroom break.  She looks at the clock a lot. She is anxious.   The preferred sleep position is laterally, with the support of 2  Soft pillows.  Dreams are reportedly frequent/vivid.  6.30 AM is the usual rise time. The patient wakes up spontaneously.  She reports not feeling refreshed or restored in AM, with symptoms such as dry mouth, morning headaches, and residual fatigue. Naps are not taken.   REVIEW OF SYSTEMS: Out of a complete 14 system review of symptoms, the patient complains only of the following symptoms, and all other reviewed systems are negative.  EXH37 ESS 7  ALLERGIES: Allergies  Allergen Reactions  . Quinine Derivatives Anaphylaxis  . Sulfa Antibiotics Anaphylaxis  . Tramadol Itching    HOME MEDICATIONS: Outpatient Medications Prior to Visit  Medication Sig Dispense Refill  . alendronate (FOSAMAX) 70 MG tablet Take 70 mg by mouth once  a week. Take with a full glass of water on an empty stomach.    . ALPRAZolam (XANAX) 0.25 MG tablet To help with insomnia while in the sleep lab. 10 tablet 0  . amitriptyline (ELAVIL) 10 MG tablet Take 1 tablet (10 mg total) by mouth at bedtime. 30 tablet 3  . aspirin 81 MG chewable tablet Chew 81 mg by mouth daily.    . brimonidine (ALPHAGAN) 0.2 % ophthalmic solution  3 (three) times daily.    Marland Kitchen gabapentin (NEURONTIN) 300 MG capsule Take 300 mg by mouth 3 (three) times daily. Patient takes one pill every 8 hours.    Marland Kitchen ibuprofen (ADVIL,MOTRIN) 600 MG tablet Take 600 mg by mouth every 6 (six) hours as needed for pain.    Marland Kitchen lisinopril-hydrochlorothiazide (ZESTORETIC) 20-12.5 MG tablet Take 1 tablet by mouth daily.    . Omega-3 Fatty Acids (FISH OIL) 500 MG CAPS Take by mouth.    Marland Kitchen omeprazole (PRILOSEC) 20 MG capsule Take 20 mg by mouth daily.    . Timolol Maleate PF 0.25 % SOLN Apply to eye.     No facility-administered medications prior to visit.    PAST MEDICAL HISTORY: Past Medical History:  Diagnosis Date  . Acid reflux   . Acquired polyneuropathy   . Aortic atherosclerosis ([place])   . Aortic root dilation (HCC)   . BMI 30.0-30.9,adult   . Chronic insomnia   . Diabetic neuropathy ([place])   . DM (diabetes mellitus) ([place])   . Frequent UTI   . GERD (gastroesophageal reflux disease)   . Glaucoma   . Hypercholesterolemia   . Hypertension   . Hyperthyroidism   . Loss of memory   . Osteopenia   . Osteoporosis   . RLS (restless legs syndrome)   . Subclavian artery stenosis (HCC)     PAST SURGICAL HISTORY: Past Surgical History:  Procedure Laterality Date  . ABDOMINAL HYSTERECTOMY    . BLADDER SUSPENSION    . CYST REMOVAL TRUNK    . KNEE SURGERY      FAMILY HISTORY: Family History  Problem Relation Age of Onset  . Heart attack Mother        Died age 79  . Diabetes Father        Died in his 93s    SOCIAL HISTORY: Social History   Socioeconomic History  . Marital status: Widowed    Spouse name: Not on file  . Number of children: Not on file  . Years of education: Not on file  . Highest education level: Not on file  Occupational History  . Not on file  Tobacco Use  . Smoking status: Never Smoker  . Smokeless tobacco: Never Used  Substance and Sexual Activity  . Alcohol use: No  . Drug use: No  . Sexual activity: Not on file   Other Topics Concern  . Not on file  Social History Narrative   Lives with daughter.  Two children.     Social Determinants of Health   Financial Resource Strain: Not on file  Food Insecurity: Not on file  Transportation Needs: Not on file  Physical Activity: Not on file  Stress: Not on file  Social Connections: Not on file  Intimate Partner Violence: Not on file      PHYSICAL EXAM  Vitals:   07/08/20 0948  BP: (!) 155/76  Pulse: 64  Weight: 160 lb (72.6 kg)  Height: 5' (1.524 m)   Body mass index is 31.25 kg/m.  Generalized:  Well developed, in no acute distress  Chest: Lungs clear to auscultation bilaterally  Neurological examination  Mentation: Alert oriented to time, place, history taking. Follows all commands speech and language fluent Cranial nerve II-XII: Extraocular movements were full, visual field were full on confrontational test Head turning and shoulder shrug  were normal and symmetric. Motor: The motor testing reveals 5 over 5 strength of all 4 extremities. Good symmetric motor tone is noted throughout.  Sensory: Sensory testing is intact to soft touch on all 4 extremities. No evidence of extinction is noted.  Gait and station: Gait is normal.    DIAGNOSTIC DATA (LABS, IMAGING, TESTING) - I reviewed patient records, labs, notes, testing and imaging myself where available.     ASSESSMENT AND PLAN 87 y.o. year old female  has a past medical history of Acid reflux, Acquired polyneuropathy, Aortic atherosclerosis (HCC), Aortic root dilation (HCC), BMI 30.0-30.9,adult, Chronic insomnia, Diabetic neuropathy ([place]), DM (diabetes mellitus) ([place]), Frequent UTI, GERD (gastroesophageal reflux disease), Glaucoma, Hypercholesterolemia, Hypertension, Hyperthyroidism, Loss of memory, Osteopenia, Osteoporosis, RLS (restless legs syndrome), and Subclavian artery stenosis ([place]). here with:  1. OSA on CPAP  - CPAP compliance excellent -Residual AHI is elevated seems to  be due to her leak.  She recently got a new mask and her events have decreased according to the graft.  We will look at a download in 1 month - Encourage patient to use CPAP nightly and > 4 hours each night - F/U in 6 months or sooner if needed   I spent 25 minutes of face-to-face and non-face-to-face time with patient.  This included previsit chart review, lab review, study review, order entry, electronic health record documentation, patient education.  Ward Givens, MSN, NP-C 07/08/2020, 10:45 AM [facility] [address] [place], [place] [address] [phone]

## 2020-07-08 NOTE — Patient Instructions (Signed)
Continue using CPAP nightly and greater than 4 hours each night Call in a month for repeat download If your symptoms worsen or you develop new symptoms please let us know.

## 2020-07-18 DIAGNOSIS — E119 Type 2 diabetes mellitus without complications: Secondary | ICD-10-CM | POA: Insufficient documentation

## 2020-07-18 DIAGNOSIS — H4089 Other specified glaucoma: Secondary | ICD-10-CM | POA: Insufficient documentation

## 2020-07-19 DIAGNOSIS — H401123 Primary open-angle glaucoma, left eye, severe stage: Secondary | ICD-10-CM | POA: Diagnosis not present

## 2020-07-19 DIAGNOSIS — H401112 Primary open-angle glaucoma, right eye, moderate stage: Secondary | ICD-10-CM | POA: Diagnosis not present

## 2020-07-23 DIAGNOSIS — N952 Postmenopausal atrophic vaginitis: Secondary | ICD-10-CM | POA: Diagnosis not present

## 2020-07-23 DIAGNOSIS — N8111 Cystocele, midline: Secondary | ICD-10-CM | POA: Diagnosis not present

## 2020-07-23 DIAGNOSIS — Z1231 Encounter for screening mammogram for malignant neoplasm of breast: Secondary | ICD-10-CM | POA: Diagnosis not present

## 2020-07-23 DIAGNOSIS — Z124 Encounter for screening for malignant neoplasm of cervix: Secondary | ICD-10-CM | POA: Diagnosis not present

## 2020-07-23 DIAGNOSIS — N819 Female genital prolapse, unspecified: Secondary | ICD-10-CM | POA: Diagnosis not present

## 2020-07-23 DIAGNOSIS — Z01411 Encounter for gynecological examination (general) (routine) with abnormal findings: Secondary | ICD-10-CM | POA: Diagnosis not present

## 2020-07-23 DIAGNOSIS — Z6834 Body mass index (BMI) 34.0-34.9, adult: Secondary | ICD-10-CM | POA: Diagnosis not present

## 2020-07-23 DIAGNOSIS — Z01419 Encounter for gynecological examination (general) (routine) without abnormal findings: Secondary | ICD-10-CM | POA: Diagnosis not present

## 2020-07-23 DIAGNOSIS — R82998 Other abnormal findings in urine: Secondary | ICD-10-CM | POA: Diagnosis not present

## 2020-08-16 DIAGNOSIS — E1169 Type 2 diabetes mellitus with other specified complication: Secondary | ICD-10-CM | POA: Diagnosis not present

## 2020-08-16 DIAGNOSIS — I10 Essential (primary) hypertension: Secondary | ICD-10-CM | POA: Diagnosis not present

## 2020-08-16 DIAGNOSIS — E1140 Type 2 diabetes mellitus with diabetic neuropathy, unspecified: Secondary | ICD-10-CM | POA: Diagnosis not present

## 2020-08-16 DIAGNOSIS — R413 Other amnesia: Secondary | ICD-10-CM | POA: Diagnosis not present

## 2020-08-16 DIAGNOSIS — M6530 Trigger finger, unspecified finger: Secondary | ICD-10-CM | POA: Diagnosis not present

## 2020-08-16 DIAGNOSIS — K219 Gastro-esophageal reflux disease without esophagitis: Secondary | ICD-10-CM | POA: Diagnosis not present

## 2020-08-16 DIAGNOSIS — N390 Urinary tract infection, site not specified: Secondary | ICD-10-CM | POA: Diagnosis not present

## 2020-10-05 DIAGNOSIS — Z23 Encounter for immunization: Secondary | ICD-10-CM | POA: Diagnosis not present

## 2020-11-26 DIAGNOSIS — H401112 Primary open-angle glaucoma, right eye, moderate stage: Secondary | ICD-10-CM | POA: Diagnosis not present

## 2020-11-26 DIAGNOSIS — H47232 Glaucomatous optic atrophy, left eye: Secondary | ICD-10-CM | POA: Diagnosis not present

## 2020-11-26 DIAGNOSIS — H26491 Other secondary cataract, right eye: Secondary | ICD-10-CM | POA: Diagnosis not present

## 2020-11-26 DIAGNOSIS — H53483 Generalized contraction of visual field, bilateral: Secondary | ICD-10-CM | POA: Diagnosis not present

## 2020-11-26 DIAGNOSIS — H401123 Primary open-angle glaucoma, left eye, severe stage: Secondary | ICD-10-CM | POA: Diagnosis not present

## 2020-11-30 ENCOUNTER — Encounter (HOSPITAL_BASED_OUTPATIENT_CLINIC_OR_DEPARTMENT_OTHER): Payer: Self-pay

## 2020-11-30 ENCOUNTER — Emergency Department (HOSPITAL_BASED_OUTPATIENT_CLINIC_OR_DEPARTMENT_OTHER): Payer: Medicare Other | Admitting: Radiology

## 2020-11-30 ENCOUNTER — Emergency Department (HOSPITAL_BASED_OUTPATIENT_CLINIC_OR_DEPARTMENT_OTHER)
Admission: EM | Admit: 2020-11-30 | Discharge: 2020-11-30 | Disposition: A | Discharge status: Home / Self Care | Payer: Medicare Other | Attending: Emergency Medicine | Admitting: Emergency Medicine

## 2020-11-30 DIAGNOSIS — E1140 Type 2 diabetes mellitus with diabetic neuropathy, unspecified: Secondary | ICD-10-CM | POA: Diagnosis not present

## 2020-11-30 DIAGNOSIS — Z79899 Other long term (current) drug therapy: Secondary | ICD-10-CM | POA: Insufficient documentation

## 2020-11-30 DIAGNOSIS — R079 Chest pain, unspecified: Secondary | ICD-10-CM | POA: Insufficient documentation

## 2020-11-30 DIAGNOSIS — Z7982 Long term (current) use of aspirin: Secondary | ICD-10-CM | POA: Diagnosis not present

## 2020-11-30 DIAGNOSIS — I10 Essential (primary) hypertension: Secondary | ICD-10-CM | POA: Diagnosis not present

## 2020-11-30 DIAGNOSIS — R0789 Other chest pain: Secondary | ICD-10-CM | POA: Diagnosis not present

## 2020-11-30 DIAGNOSIS — R059 Cough, unspecified: Secondary | ICD-10-CM

## 2020-11-30 LAB — CBC
HCT: 42.4 % (ref 36.0–46.0)
Hemoglobin: 14.6 g/dL (ref 12.0–15.0)
MCH: 30.7 pg (ref 26.0–34.0)
MCHC: 34.4 g/dL (ref 30.0–36.0)
MCV: 89.1 fL (ref 80.0–100.0)
Platelets: 135 10*3/uL — ABNORMAL LOW (ref 150–400)
RBC: 4.76 MIL/uL (ref 3.87–5.11)
RDW: 12.8 % (ref 11.5–15.5)
WBC: 5.7 10*3/uL (ref 4.0–10.5)
nRBC: 0.4 % — ABNORMAL HIGH (ref 0.0–0.2)

## 2020-11-30 LAB — BASIC METABOLIC PANEL
Anion gap: 10 (ref 5–15)
BUN: 27 mg/dL — ABNORMAL HIGH (ref 8–23)
CO2: 23 mmol/L (ref 22–32)
Calcium: 9.2 mg/dL (ref 8.9–10.3)
Chloride: 105 mmol/L (ref 98–111)
Creatinine, Ser: 0.68 mg/dL (ref 0.44–1.00)
GFR, Estimated: >60 mL/min (ref >60)
Glucose, Bld: 171 mg/dL — ABNORMAL HIGH (ref 70–99)
Potassium: 3.9 mmol/L (ref 3.5–5.1)
Sodium: 138 mmol/L (ref 135–145)

## 2020-11-30 LAB — TROPONIN I (HIGH SENSITIVITY)
Troponin I (High Sensitivity): 5 ng/L (ref <18)
Troponin I (High Sensitivity): 5 ng/L (ref <18)

## 2020-11-30 NOTE — Discharge Instructions (Signed)
Follow-up with your primary care doctor for recheck early this coming week.  If you have recurrent episodes of chest pain, difficulty breathing, or other new concerning symptom, come back to ER for reassessment.

## 2020-11-30 NOTE — ED Provider Notes (Signed)
[facility] EMERGENCY DEPT Provider Note   CSN: [id number] Arrival date & time: 11/30/20  1114     History Chief Complaint  Patient presents with  . Chest Pain    Karen Blankenship is a 87 y.o. female.  Presented to ER with concern for chest pain.  Patient reports she had an episode of chest pain last night around 10 PM.  Took 325 mg aspirin and went to bed.  When she woke up this morning she did not initially notice any ongoing pain but when she went into the kitchen she noted having pain again.  Pain was up to 8 out of 10 in severity, this morning it lasted around 30 minutes or so but then resolved.  Denies any ongoing pain at present.  Denies any other associated symptoms.  Specifically she denies associated numbness, weakness, back pain, fevers, chills, shortness of breath.  Has history of diabetes but states this is controlled without needing any medication.  Hypertension.  Denies history of any coronary artery disease.  Non-smoker.  Utilize Art therapist throughout visit.  Additional history was obtained from daughter as well at bedside who speaks fluent [place].  HPI     Past Medical History:  Diagnosis Date  . Acid reflux   . Acquired polyneuropathy   . Aortic atherosclerosis ([place])   . Aortic root dilation (HCC)   . BMI 30.0-30.9,adult   . Chronic insomnia   . Diabetic neuropathy ([place])   . DM (diabetes mellitus) ([place])   . Frequent UTI   . GERD (gastroesophageal reflux disease)   . Glaucoma   . Hypercholesterolemia   . Hypertension   . Hyperthyroidism   . Loss of memory   . Osteopenia   . Osteoporosis   . RLS (restless legs syndrome)   . Subclavian artery stenosis [facility])     Patient Active Problem List   Diagnosis Date Noted  . Complex sleep apnea syndrome 03/08/2020  . Chronic intermittent hypoxia with obstructive sleep apnea 03/08/2020  . RLS (restless legs syndrome) 02/05/2020  . Chronic insomnia 02/05/2020  . Loud snoring 02/05/2020  .  Neuropathy 02/05/2020  . PVD (peripheral vascular disease) ([place]) 12/29/2019  . Decreased exercise tolerance 12/28/2019  . Educated about COVID-19 virus infection 12/28/2019  . HTN (hypertension), benign 09/25/2013  . Hyperlipidemia 09/25/2013  . Osteoarthritis, knee 09/25/2013    Past Surgical History:  Procedure Laterality Date  . ABDOMINAL HYSTERECTOMY    . BLADDER SUSPENSION    . CYST REMOVAL TRUNK    . KNEE SURGERY       OB History   No obstetric history on file.     Family History  Problem Relation Age of Onset  . Heart attack Mother        Died age 48  . Diabetes Father        Died in his 23s    Social History   Tobacco Use  . Smoking status: Never Smoker  . Smokeless tobacco: Never Used  Substance Use Topics  . Alcohol use: No  . Drug use: No    Home Medications Prior to Admission medications   Medication Sig Start Date End Date Taking? Authorizing Provider  alendronate (FOSAMAX) 70 MG tablet Take 70 mg by mouth once a week. Take with a full glass of water on an empty stomach.   Yes [provider]  ALPRAZolam Duanne Moron) 0.25 MG tablet To help with insomnia while in the sleep lab. 03/08/20  Yes Dohmeier, Asencion Partridge, MD  amitriptyline (ELAVIL) 10 MG tablet Take 1 tablet (10 mg total) by mouth at bedtime. 02/05/20  Yes Dohmeier, Asencion Partridge, MD  aspirin 81 MG chewable tablet Chew 81 mg by mouth daily.   Yes [provider]  brimonidine (ALPHAGAN) 0.2 % ophthalmic solution 3 (three) times daily.   Yes [provider]  gabapentin (NEURONTIN) 300 MG capsule Take 300 mg by mouth 3 (three) times daily. Patient takes one pill every 8 hours.   Yes [provider]  ibuprofen (ADVIL,MOTRIN) 600 MG tablet Take 600 mg by mouth every 6 (six) hours as needed for pain.   Yes [provider]  lisinopril-hydrochlorothiazide (ZESTORETIC) 20-12.5 MG tablet Take 1 tablet by mouth daily. 11/28/19  Yes [provider]  Omega-3 Fatty Acids (FISH  OIL) 500 MG CAPS Take by mouth.   Yes [provider]  omeprazole (PRILOSEC) 20 MG capsule Take 20 mg by mouth daily.   Yes [provider]  Timolol Maleate PF 0.25 % SOLN Apply to eye.   Yes [provider]    Allergies    Quinine derivatives, Sulfa antibiotics, and Tramadol  Review of Systems   Review of Systems  Constitutional: Negative for chills and fever.  HENT: Negative for ear pain and sore throat.   Eyes: Negative for pain and visual disturbance.  Respiratory: Negative for cough and shortness of breath.   Cardiovascular: Positive for chest pain. Negative for palpitations.  Gastrointestinal: Negative for abdominal pain and vomiting.  Genitourinary: Negative for dysuria and hematuria.  Musculoskeletal: Negative for arthralgias and back pain.  Skin: Negative for color change and rash.  Neurological: Negative for seizures and syncope.  All other systems reviewed and are negative.   Physical Exam Updated Vital Signs BP (!) 160/48   Pulse (!) 52   Temp 98.5 F (36.9 C) (Oral)   Resp 17   SpO2 97%   Physical Exam Vitals and nursing note reviewed.  Constitutional:      General: She is not in acute distress.    Appearance: She is well-developed.  HENT:     Head: Normocephalic and atraumatic.  Eyes:     Conjunctiva/sclera: Conjunctivae normal.  Cardiovascular:     Rate and Rhythm: Normal rate and regular rhythm.     Heart sounds: No murmur heard.   Pulmonary:     Effort: Pulmonary effort is normal. No respiratory distress.     Breath sounds: Normal breath sounds.  Abdominal:     Palpations: Abdomen is soft.     Tenderness: There is no abdominal tenderness.  Musculoskeletal:     Cervical back: Neck supple.  Skin:    General: Skin is warm and dry.  Neurological:     General: No focal deficit present.     Mental Status: She is alert.  Psychiatric:        Mood and Affect: Mood normal.        Behavior: Behavior normal.     ED  Results / Procedures / Treatments   Labs (all labs ordered are listed, but only abnormal results are displayed) Labs Reviewed  BASIC METABOLIC PANEL - Abnormal; Notable for the following components:      Result Value   Glucose, Bld 171 (*)    BUN 27 (*)    All other components within normal limits  CBC - Abnormal; Notable for the following components:   Platelets 135 (*)    nRBC 0.4 (*)    All other components within normal limits  TROPONIN I (HIGH SENSITIVITY)  TROPONIN I (HIGH SENSITIVITY)    EKG EKG Interpretation  Date/Time:  Saturday November 30 2020 11:25:13 EDT Ventricular Rate:  58 PR Interval:  193 QRS Duration: 90 QT Interval:  420 QTC Calculation: 413 R Axis:   11 Text Interpretation: Sinus rhythm Low voltage, precordial leads Confirmed by Madalyn Rob [phone]) on 11/30/2020 11:52:57 AM   Radiology DG Chest 1 View  Result Date: 11/30/2020 CLINICAL DATA:  Chest discomfort EXAM: CHEST  1 VIEW COMPARISON:  Chest CT July 21, 2019 FINDINGS: No edema or airspace opacity. Heart is borderline enlarged with pulmonary vascularity normal. No adenopathy. There is aortic atherosclerosis. No pneumothorax. No bone lesions. IMPRESSION: No edema or airspace opacity. Heart borderline enlarged. Aortic Atherosclerosis (ICD10-I70.0). Electronically Signed   By: Lowella Grip III M.D.   On: 11/30/2020 12:29    Procedures Procedures   Medications Ordered in ED Medications - No data to display  ED Course  I have reviewed the triage vital signs and the nursing notes.  Pertinent labs & imaging results that were available during my care of the patient were reviewed by me and considered in my medical decision making (see chart for details).    MDM Rules/Calculators/A&P                         87 year old lady presented to ER with concern for couple episodes of chest pain.  On exam patient is well-appearing in no distress.  EKG does not have clear ischemic change, troponin x2 is  within normal limits.  Doubt ACS.  CXR per my review and per radiologist has no acute findings.  Remainder of labs are grossly stable.  Given work-up today, no ongoing symptoms, believe patient can be discharged and managed in the outpatient setting.  Recommend she follow-up with her primary doctor.    After the discussed management above, the patient was determined to be safe for discharge.  The patient was in agreement with this plan and all questions regarding their care were answered.  ED return precautions were discussed and the patient will return to the ED with any significant worsening of condition.   Final Clinical Impression(s) / ED Diagnoses Final diagnoses:  Chest pain, unspecified type    Rx / DC Orders ED Discharge Orders    None       Lucrezia Starch, MD 11/30/20 1452

## 2020-11-30 NOTE — ED Notes (Signed)
EKG obtained. Attempted to place IV in R and L AC without success x2. No complications noted. Charge RN made aware.

## 2020-11-30 NOTE — ED Triage Notes (Signed)
Her daughter is with her, and tells [place] pt. C/o central chest discomfort since about 10 p.m. yesterday which persists. She is alert and in nmo distress.

## 2021-01-06 ENCOUNTER — Encounter: Payer: Self-pay | Admitting: Adult Health

## 2021-01-06 ENCOUNTER — Ambulatory Visit (INDEPENDENT_AMBULATORY_CARE_PROVIDER_SITE_OTHER): Payer: Medicare Other | Admitting: Adult Health

## 2021-01-06 VITALS — BP 134/66 | HR 58 | Ht 61.0 in | Wt 167.0 lb

## 2021-01-06 DIAGNOSIS — G4733 Obstructive sleep apnea (adult) (pediatric): Secondary | ICD-10-CM

## 2021-01-06 DIAGNOSIS — Z9989 Dependence on other enabling machines and devices: Secondary | ICD-10-CM

## 2021-01-06 NOTE — Progress Notes (Addendum)
PATIENT: Karen Blankenship DOB: February 12, 1934  REASON FOR VISIT: follow up HISTORY FROM: patient  HISTORY OF PRESENT ILLNESS: Today 01/06/21:  [name] is an 87 year old female with a history of obstructive sleep apnea on CPAP.  She returns today for follow-up.  Her daughter is with her and interprets for her.  They report that the CPAP is working well.  She denies any new issues.  She returns today for an evaluation.    07/08/20: [name] is an 87 year old female with a history of obstructive sleep apnea on CPAP.  She returns today for follow-up.  Her download indicates that she use her machine nightly for compliance of 100%.  She used her machine greater than 4 hours 25 days for compliance of 83%.  On average she uses her machine 5 hours and 1 minute.  Her residual AHI is 18.1 on 718 cm of water with an EPR of 3.    Leak in the 95th percentile is 35.7 L/min.  Apnea index revealed central was 3.1 and obstructive 3.1, unknown 9.2 the patient's daughter states that she recently changed mask.  When looking at the graph her events have decreased approximately 5 events an hour when she has a good seal.  HISTORY (Copied from Dr.Dohmeier's note) Karen Blankenship is a 87 y.o. year old Other or two or more races female patient seen here as a referral on 02/05/2020 from Dr hochrein for a sleep consultation.  Chief concern according to patient :  pt with daughter, Dr. Cristi Loron, in  rm 10. presents today for having problems with falling asleep,  averaging about 3 hours of sleep a day and complains of feeling very tired.   I have the pleasure of seeing [name] today, a right -handed female of [place] with a possible sleep disorder.  She has a past medical history of  Acquired diabetic polyneuropathy, Aortic atherosclerosis (HCC), Aortic root dilation (HCC), BMI 30.0-30.9,adult, Chronic insomnia ([place]), Frequent UTI, GERD (gastroesophageal reflux disease), Glaucoma, Cataract, Hypercholesterolemia,  Hypertension, Hyperthyroidism, Loss of memory,  Osteoporosis, RLS (restless legs syndrome), and Subclavian artery stenosis ([place]).     Sleep relevant medical history: sleep changed 6 years ago, after her husband passed away- urinary frequency,  Reversed her nocturia- GERD- Family medical /sleep history:no other family member with OSA.  Social history:  Patient is retired from Administrator-  and lives in a household with her daughter and her partner.  Family status is widowed, with 2 adult daughters.    Tobacco use; none.  ETOH use ; none ,  Caffeine intake in form of Coffee( 1 cup in AM ) Soda( rare ) Tea ( none ) no energy drinks. Regular exercise in form of walking, balance problems, knee pain, foot neuropathy. .     Sleep habits are as follows: The patient's dinner time is between 5-6 PM.  The patient goes to bed at 12.30 AM and some days sleeps right away- and continues to sleep for 2-4 hours, wakes for one bathroom break.  She looks at the clock a lot. She is anxious.   The preferred sleep position is laterally, with the support of 2  Soft pillows.  Dreams are reportedly frequent/vivid.  6.30 AM is the usual rise time. The patient wakes up spontaneously.  She reports not feeling refreshed or restored in AM, with symptoms such as dry mouth, morning headaches, and residual fatigue. Naps are not taken.    REVIEW OF SYSTEMS: Out of a complete 14 system  review of symptoms, the patient complains only of the following symptoms, and all other reviewed systems are negative.   ESS 6  ALLERGIES: Allergies  Allergen Reactions   Quinine Derivatives Anaphylaxis   Sulfa Antibiotics Anaphylaxis   Tramadol Itching    HOME MEDICATIONS: Outpatient Medications Prior to Visit  Medication Sig Dispense Refill   alendronate (FOSAMAX) 70 MG tablet Take 70 mg by mouth once a week. Take with a full glass of water on an empty stomach.     ALPRAZolam (XANAX) 0.25 MG tablet To help with insomnia  while in the sleep lab. 10 tablet 0   amitriptyline (ELAVIL) 10 MG tablet Take 1 tablet (10 mg total) by mouth at bedtime. 30 tablet 3   aspirin 81 MG chewable tablet Chew 81 mg by mouth daily.     Boswellia-Glucosamine-Vit D (OSTEO BI-FLEX ONE PER DAY PO) Take 2 tablets by mouth daily.     brimonidine (ALPHAGAN) 0.2 % ophthalmic solution 3 (three) times daily.     cephALEXin (KEFLEX) 500 MG capsule Take 500 mg by mouth in the morning and at bedtime. For 3 days as needed for UTIs     gabapentin (NEURONTIN) 300 MG capsule Take 300 mg by mouth 3 (three) times daily. Patient takes one pill every 8 hours.     ibuprofen (ADVIL,MOTRIN) 600 MG tablet Take 600 mg by mouth every 6 (six) hours as needed for pain.     lisinopril-hydrochlorothiazide (ZESTORETIC) 20-12.5 MG tablet Take 1 tablet by mouth daily.     Multiple Vitamins-Minerals (CENTRUM SILVER PO) Take by mouth.     Omega-3 Fatty Acids (FISH OIL) 500 MG CAPS Take by mouth.     omeprazole (PRILOSEC) 20 MG capsule Take 20 mg by mouth daily.     Timolol Maleate PF 0.25 % SOLN Apply to eye.     No facility-administered medications prior to visit.    PAST MEDICAL HISTORY: Past Medical History:  Diagnosis Date   Acid reflux    Acquired polyneuropathy    Aortic atherosclerosis (HCC)    Aortic root dilation (HCC)    BMI 30.0-30.9,adult    Chronic insomnia    Diabetic neuropathy (HCC)    DM (diabetes mellitus) (HCC)    Frequent UTI    GERD (gastroesophageal reflux disease)    Glaucoma    Hypercholesterolemia    Hypertension    Hyperthyroidism    Loss of memory    Osteopenia    Osteoporosis    RLS (restless legs syndrome)    Subclavian artery stenosis (HCC)     PAST SURGICAL HISTORY: Past Surgical History:  Procedure Laterality Date   ABDOMINAL HYSTERECTOMY     BLADDER SUSPENSION     CYST REMOVAL TRUNK     KNEE SURGERY      FAMILY HISTORY: Family History  Problem Relation Age of Onset   Heart attack Mother        Died age  11   Diabetes Father        Died in his 51s    SOCIAL HISTORY: Social History   Socioeconomic History   Marital status: Widowed    Spouse name: Not on file   Number of children: Not on file   Years of education: Not on file   Highest education level: Not on file  Occupational History   Not on file  Tobacco Use   Smoking status: Never   Smokeless tobacco: Never  Substance and Sexual Activity   Alcohol use: No  Drug use: No   Sexual activity: Not on file  Other Topics Concern   Not on file  Social History Narrative   Lives with daughter.  Two children.     Social Determinants of Health   Financial Resource Strain: Not on file  Food Insecurity: Not on file  Transportation Needs: Not on file  Physical Activity: Not on file  Stress: Not on file  Social Connections: Not on file  Intimate Partner Violence: Not on file      PHYSICAL EXAM  Vitals:   01/06/21 1131  BP: 134/66  Pulse: (!) 58  Weight: 167 lb (75.8 kg)  Height: 5\' 1"  (1.549 m)    Body mass index is 31.55 kg/m.  Generalized: Well developed, in no acute distress  Chest: Lungs clear to auscultation bilaterally  Neurological examination  Mentation: Alert oriented to time, place, history taking. Follows all commands speech and language fluent Cranial nerve II-XII: Extraocular movements were full, visual field were full on confrontational test Head turning and shoulder shrug  were normal and symmetric. Motor: The motor testing reveals 5 over 5 strength of all 4 extremities. Good symmetric motor tone is noted throughout.  Sensory: Sensory testing is intact to soft touch on all 4 extremities. No evidence of extinction is noted.  Gait and station: Gait is normal.    DIAGNOSTIC DATA (LABS, IMAGING, TESTING) - I reviewed patient records, labs, notes, testing and imaging myself where available.     ASSESSMENT AND PLAN 87 y.o. year old female  has a past medical history of Acid reflux, Acquired  polyneuropathy, Aortic atherosclerosis (HCC), Aortic root dilation (HCC), BMI 30.0-30.9,adult, Chronic insomnia, Diabetic neuropathy ([place]), DM (diabetes mellitus) ([place]), Frequent UTI, GERD (gastroesophageal reflux disease), Glaucoma, Hypercholesterolemia, Hypertension, Hyperthyroidism, Loss of memory, Osteopenia, Osteoporosis, RLS (restless legs syndrome), and Subclavian artery stenosis ([place]). here with:  OSA on CPAP  - CPAP compliance excellent -Residual AHI is in normal range. - Encourage patient to use CPAP nightly and > 4 hours each night - F/U in 6 months or sooner if needed   Ward Givens, MSN, NP-C 01/06/2021, 11:32 AM [facility] [address] [place], [place] [address] [phone]  I reviewed the above note and documentation by the Nurse Practitioner and agree with the history, exam, assessment and plan as outlined above. I was available for consultation. Star Age, MD, PhD [facility])

## 2021-01-06 NOTE — Patient Instructions (Signed)
Continue using CPAP nightly and greater than 4 hours each night °If your symptoms worsen or you develop new symptoms please let us know.  ° °

## 2021-01-08 DIAGNOSIS — N8110 Cystocele, unspecified: Secondary | ICD-10-CM | POA: Insufficient documentation

## 2021-01-08 DIAGNOSIS — R338 Other retention of urine: Secondary | ICD-10-CM | POA: Diagnosis not present

## 2021-01-08 DIAGNOSIS — N813 Complete uterovaginal prolapse: Secondary | ICD-10-CM | POA: Diagnosis not present

## 2021-01-08 DIAGNOSIS — Z87440 Personal history of urinary (tract) infections: Secondary | ICD-10-CM | POA: Diagnosis not present

## 2021-01-08 DIAGNOSIS — E119 Type 2 diabetes mellitus without complications: Secondary | ICD-10-CM | POA: Diagnosis not present

## 2021-02-03 DIAGNOSIS — R072 Precordial pain: Secondary | ICD-10-CM | POA: Insufficient documentation

## 2021-02-03 NOTE — Progress Notes (Signed)
Cardiology Office Note   Date:  02/04/2021   ID:  Karen Blankenship, DOB Jun 07, 1934, MRN [id number]  PCP:  London Pepper, MD  Cardiologist:   Minus Breeding, MD Referring:  London Pepper, MD  Chief Complaint  Patient presents with   Chest Pain       History of Present Illness: Karen Blankenship is a 87 y.o. female who presents for evaluation of chest pain.  I saw her last year and she had SOB.  She had a negative perfusion study.  She was in the ED in June with chest pain.  I reviewed these records for this visit.    There was no objective evidence of ischemia.    Since that time she has done okay.  She did have some right-sided chest discomfort coincidently yesterday.  This was sharp.  However, she otherwise is not complaining of any discomfort.  She walks around her neighborhood and does this daily.  She walks slowly and stops to rest.  She has not had any new chest pressure, neck or arm discomfort.  He does not have any new shortness of breath, PND or orthopne    Past Medical History:  Diagnosis Date   Acid reflux    Acquired polyneuropathy    Aortic atherosclerosis (HCC)    Aortic root dilation (HCC)    BMI 30.0-30.9,adult    Chronic insomnia    Diabetic neuropathy (HCC)    DM (diabetes mellitus) (HCC)    Frequent UTI    GERD (gastroesophageal reflux disease)    Glaucoma    Hypercholesterolemia    Hypertension    Hyperthyroidism    Loss of memory    Osteopenia    Osteoporosis    RLS (restless legs syndrome)    Subclavian artery stenosis (HCC)     Past Surgical History:  Procedure Laterality Date   ABDOMINAL HYSTERECTOMY     BLADDER SUSPENSION     CYST REMOVAL TRUNK     KNEE SURGERY       Current Outpatient Medications  Medication Sig Dispense Refill   alendronate (FOSAMAX) 70 MG tablet Take 70 mg by mouth once a week. Take with a full glass of water on an empty stomach.     aspirin 81 MG chewable tablet Chew 81 mg by mouth daily.     Boswellia-Glucosamine-Vit D  (OSTEO BI-FLEX ONE PER DAY PO) Take 2 tablets by mouth daily.     brimonidine (ALPHAGAN) 0.2 % ophthalmic solution 3 (three) times daily.     cephALEXin (KEFLEX) 500 MG capsule Take 500 mg by mouth in the morning and at bedtime. For 3 days as needed for UTIs     gabapentin (NEURONTIN) 300 MG capsule Take 300 mg by mouth 3 (three) times daily. Patient takes one pill every 8 hours.     ibuprofen (ADVIL,MOTRIN) 600 MG tablet Take 600 mg by mouth every 6 (six) hours as needed for pain.     lisinopril-hydrochlorothiazide (ZESTORETIC) 20-12.5 MG tablet Take 1 tablet by mouth daily.     Multiple Vitamins-Minerals (CENTRUM SILVER PO) Take by mouth.     Omega-3 Fatty Acids (FISH OIL) 500 MG CAPS Take by mouth.     omeprazole (PRILOSEC) 20 MG capsule Take 20 mg by mouth daily.     Timolol Maleate PF 0.25 % SOLN Apply to eye.     No current facility-administered medications for this visit.    Allergies:   Quinine derivatives, Sulfa antibiotics, and Tramadol    ROS:  Please see the history of present illness.   Otherwise, review of systems are positive for none.   All other systems are reviewed and negative.    PHYSICAL EXAM: VS:  BP 138/60 (BP Location: Left Arm)   Pulse 61   Ht '5\' 1"'$  (1.549 m)   Wt 167 lb 9.6 oz (76 kg)   SpO2 98%   BMI 31.67 kg/m  , BMI Body mass index is 31.67 kg/m. GENERAL:  Well appearing NECK:  No jugular venous distention, waveform within normal limits, carotid upstroke brisk and symmetric, no bruits, no thyromegaly LUNGS:  Clear to auscultation bilaterally CHEST:  Unremarkable HEART:  PMI not displaced or sustained,S1 and S2 within normal limits, no S3, no S4, no clicks, no rubs, no murmurs ABD:  Flat, positive bowel sounds normal in frequency in pitch, no bruits, no rebound, no guarding, no midline pulsatile mass, no hepatomegaly, no splenomegaly EXT:  2 plus pulses throughout, no edema, no cyanosis no clubbing  EKG:  EKG is not ordered today. The ekg ordered  11/30/2020 demonstrates sinus rhythm, rate 58, axis within normal limits, intervals within normal limits, no acute ST-T wave changes.   Recent Labs: 11/30/2020: BUN 27; Creatinine, Ser 0.68; Hemoglobin 14.6; Platelets 135; Potassium 3.9; Sodium 138    Lipid Panel No results found for: CHOL, TRIG, HDL, CHOLHDL, VLDL, LDLCALC, LDLDIRECT    Wt Readings from Last 3 Encounters:  02/04/21 167 lb 9.6 oz (76 kg)  01/06/21 167 lb (75.8 kg)  07/08/20 160 lb (72.6 kg)      Other studies Reviewed: Additional studies/ records that were reviewed today include: ED records Review of the above records demonstrates:  Please see elsewhere in the note.     ASSESSMENT AND PLAN:  CHEST PAIN:   Her chest pain in June was nonanginal.  Pain yesterday also sounds nonanginal.  She had a negative stress perfusion study last year.  She has had no consistent symptoms suggestive of angina since then.  She will continue with risk reduction.   INSOMNIA:    She is having CPAP adjusted by Dr. Brett Fairy.  She is done much better in terms of her sleeping and seems to have less fatigue with his management.  SUBCLAVIAN STENOSIS:    She does have subclavian stenosis and she needs to have blood pressures in her right arms for follow-up.   HTN: Blood pressure is slightly elevated in her right arm and her daughter will follow this couple times daily and send me the results.  She might need further med adjustments with increasing her lisinopril.     Current medicines are reviewed at length with the patient today.  The patient does not have concerns regarding medicines.  The following changes have been made:   None  Labs/ tests ordered today include:   No orders of the defined types were placed in this encounter.    Disposition:   FU with me in 1 year.      Signed, Minus Breeding, MD  02/04/2021 5:26 PM    [place]

## 2021-02-04 ENCOUNTER — Encounter: Payer: Self-pay | Admitting: Cardiology

## 2021-02-04 ENCOUNTER — Other Ambulatory Visit: Payer: Self-pay

## 2021-02-04 ENCOUNTER — Ambulatory Visit (INDEPENDENT_AMBULATORY_CARE_PROVIDER_SITE_OTHER): Payer: Medicare Other | Admitting: Cardiology

## 2021-02-04 VITALS — BP 138/60 | HR 61 | Ht 61.0 in | Wt 167.6 lb

## 2021-02-04 DIAGNOSIS — R072 Precordial pain: Secondary | ICD-10-CM | POA: Diagnosis not present

## 2021-02-04 DIAGNOSIS — I10 Essential (primary) hypertension: Secondary | ICD-10-CM | POA: Diagnosis not present

## 2021-02-04 DIAGNOSIS — I739 Peripheral vascular disease, unspecified: Secondary | ICD-10-CM

## 2021-02-04 NOTE — Patient Instructions (Signed)
Medication Instructions:  Continue current medications  *If you need a refill on your cardiac medications before your next appointment, please call your pharmacy*   Lab Work: None Ordered   Testing/Procedures: None Ordered   Follow-Up: At [facility], you and your health needs are our priority.  As part of our continuing mission to provide you with exceptional heart care, we have created designated Provider Care Teams.  These Care Teams include your primary Cardiologist (physician) and Advanced Practice Providers (APPs -  Physician Assistants and Nurse Practitioners) who all work together to provide you with the care you need, when you need it.  We recommend signing up for the patient portal called "MyChart".  Sign up information is provided on this After Visit Summary.  MyChart is used to connect with patients for Virtual Visits (Telemedicine).  Patients are able to view lab/test results, encounter notes, upcoming appointments, etc.  Non-urgent messages can be sent to your provider as well.   To learn more about what you can do with MyChart, go to [email or web address].    Your next appointment:   1 year(s)  The format for your next appointment:   In Person  Provider:   You may see Minus Breeding, MD or one of the following Advanced Practice Providers on your designated Care Team:   Rosaria Ferries, PA-C Caron Presume, PA-C Jory Sims, DNP, ANP   Other Instructions Keep a daily blood pressure check diary

## 2021-02-14 DIAGNOSIS — Z0000 Encounter for general adult medical examination without abnormal findings: Secondary | ICD-10-CM | POA: Diagnosis not present

## 2021-02-14 DIAGNOSIS — I700 Atherosclerosis of aorta: Secondary | ICD-10-CM | POA: Diagnosis not present

## 2021-02-14 DIAGNOSIS — I77819 Aortic ectasia, unspecified site: Secondary | ICD-10-CM | POA: Diagnosis not present

## 2021-02-14 DIAGNOSIS — M8580 Other specified disorders of bone density and structure, unspecified site: Secondary | ICD-10-CM | POA: Diagnosis not present

## 2021-02-14 DIAGNOSIS — G4733 Obstructive sleep apnea (adult) (pediatric): Secondary | ICD-10-CM | POA: Diagnosis not present

## 2021-02-14 DIAGNOSIS — I10 Essential (primary) hypertension: Secondary | ICD-10-CM | POA: Diagnosis not present

## 2021-02-14 DIAGNOSIS — I771 Stricture of artery: Secondary | ICD-10-CM | POA: Diagnosis not present

## 2021-02-14 DIAGNOSIS — N390 Urinary tract infection, site not specified: Secondary | ICD-10-CM | POA: Diagnosis not present

## 2021-02-14 DIAGNOSIS — E1169 Type 2 diabetes mellitus with other specified complication: Secondary | ICD-10-CM | POA: Diagnosis not present

## 2021-02-22 DIAGNOSIS — I10 Essential (primary) hypertension: Secondary | ICD-10-CM

## 2021-02-24 DIAGNOSIS — Z23 Encounter for immunization: Secondary | ICD-10-CM | POA: Diagnosis not present

## 2021-02-26 MED ORDER — LISINOPRIL-HYDROCHLOROTHIAZIDE 20-12.5 MG PO TABS: 2.0000 | ORAL_TABLET | Freq: Every day | ORAL | 3 refills | Status: DC

## 2021-03-11 DIAGNOSIS — H53453 Other localized visual field defect, bilateral: Secondary | ICD-10-CM | POA: Diagnosis not present

## 2021-03-11 DIAGNOSIS — H401123 Primary open-angle glaucoma, left eye, severe stage: Secondary | ICD-10-CM | POA: Diagnosis not present

## 2021-03-11 DIAGNOSIS — H26491 Other secondary cataract, right eye: Secondary | ICD-10-CM | POA: Diagnosis not present

## 2021-03-12 ENCOUNTER — Other Ambulatory Visit: Payer: Self-pay

## 2021-03-12 DIAGNOSIS — I10 Essential (primary) hypertension: Secondary | ICD-10-CM

## 2021-03-13 LAB — BASIC METABOLIC PANEL
BUN/Creatinine Ratio: 36 — ABNORMAL HIGH (ref 12–28)
BUN: 27 mg/dL (ref 8–27)
CO2: 24 mmol/L (ref 20–29)
Calcium: 9.9 mg/dL (ref 8.7–10.3)
Chloride: 100 mmol/L (ref 96–106)
Creatinine, Ser: 0.74 mg/dL (ref 0.57–1.00)
Glucose: 184 mg/dL — ABNORMAL HIGH (ref 65–99)
Potassium: 4.6 mmol/L (ref 3.5–5.2)
Sodium: 137 mmol/L (ref 134–144)
eGFR: 80 mL/min/{1.73_m2} (ref >59)

## 2021-03-24 DIAGNOSIS — Z23 Encounter for immunization: Secondary | ICD-10-CM | POA: Diagnosis not present

## 2021-04-07 DIAGNOSIS — Z23 Encounter for immunization: Secondary | ICD-10-CM | POA: Diagnosis not present

## 2021-04-07 DIAGNOSIS — M8580 Other specified disorders of bone density and structure, unspecified site: Secondary | ICD-10-CM | POA: Insufficient documentation

## 2021-04-07 DIAGNOSIS — N8110 Cystocele, unspecified: Secondary | ICD-10-CM | POA: Diagnosis not present

## 2021-04-07 DIAGNOSIS — K219 Gastro-esophageal reflux disease without esophagitis: Secondary | ICD-10-CM | POA: Insufficient documentation

## 2021-04-07 DIAGNOSIS — Z01818 Encounter for other preprocedural examination: Secondary | ICD-10-CM | POA: Diagnosis not present

## 2021-04-07 DIAGNOSIS — E119 Type 2 diabetes mellitus without complications: Secondary | ICD-10-CM | POA: Diagnosis not present

## 2021-04-17 DIAGNOSIS — R800 Isolated proteinuria: Secondary | ICD-10-CM | POA: Diagnosis not present

## 2021-04-17 DIAGNOSIS — N390 Urinary tract infection, site not specified: Secondary | ICD-10-CM | POA: Diagnosis not present

## 2021-04-17 DIAGNOSIS — N3281 Overactive bladder: Secondary | ICD-10-CM | POA: Insufficient documentation

## 2021-04-17 DIAGNOSIS — R82998 Other abnormal findings in urine: Secondary | ICD-10-CM | POA: Diagnosis not present

## 2021-04-17 DIAGNOSIS — N819 Female genital prolapse, unspecified: Secondary | ICD-10-CM | POA: Insufficient documentation

## 2021-04-22 DIAGNOSIS — N8110 Cystocele, unspecified: Secondary | ICD-10-CM | POA: Diagnosis not present

## 2021-04-22 DIAGNOSIS — G2581 Restless legs syndrome: Secondary | ICD-10-CM | POA: Diagnosis not present

## 2021-04-22 DIAGNOSIS — E785 Hyperlipidemia, unspecified: Secondary | ICD-10-CM | POA: Diagnosis not present

## 2021-04-22 DIAGNOSIS — G4733 Obstructive sleep apnea (adult) (pediatric): Secondary | ICD-10-CM | POA: Diagnosis not present

## 2021-04-22 DIAGNOSIS — H409 Unspecified glaucoma: Secondary | ICD-10-CM | POA: Diagnosis not present

## 2021-04-22 DIAGNOSIS — I10 Essential (primary) hypertension: Secondary | ICD-10-CM | POA: Diagnosis not present

## 2021-04-22 DIAGNOSIS — K219 Gastro-esophageal reflux disease without esophagitis: Secondary | ICD-10-CM | POA: Diagnosis not present

## 2021-04-22 DIAGNOSIS — R339 Retention of urine, unspecified: Secondary | ICD-10-CM | POA: Diagnosis not present

## 2021-04-22 DIAGNOSIS — M8580 Other specified disorders of bone density and structure, unspecified site: Secondary | ICD-10-CM | POA: Diagnosis not present

## 2021-04-22 DIAGNOSIS — E119 Type 2 diabetes mellitus without complications: Secondary | ICD-10-CM | POA: Diagnosis not present

## 2021-04-22 DIAGNOSIS — D696 Thrombocytopenia, unspecified: Secondary | ICD-10-CM | POA: Diagnosis not present

## 2021-04-22 DIAGNOSIS — N993 Prolapse of vaginal vault after hysterectomy: Secondary | ICD-10-CM | POA: Diagnosis not present

## 2021-04-29 DIAGNOSIS — N8110 Cystocele, unspecified: Secondary | ICD-10-CM | POA: Diagnosis not present

## 2021-06-10 DIAGNOSIS — Z8742 Personal history of other diseases of the female genital tract: Secondary | ICD-10-CM | POA: Diagnosis not present

## 2021-06-29 DIAGNOSIS — Z20822 Contact with and (suspected) exposure to covid-19: Secondary | ICD-10-CM | POA: Diagnosis not present

## 2021-07-15 DIAGNOSIS — H26491 Other secondary cataract, right eye: Secondary | ICD-10-CM | POA: Diagnosis not present

## 2021-07-15 DIAGNOSIS — H401123 Primary open-angle glaucoma, left eye, severe stage: Secondary | ICD-10-CM | POA: Diagnosis not present

## 2021-07-15 DIAGNOSIS — H401112 Primary open-angle glaucoma, right eye, moderate stage: Secondary | ICD-10-CM | POA: Diagnosis not present

## 2021-07-15 DIAGNOSIS — H53453 Other localized visual field defect, bilateral: Secondary | ICD-10-CM | POA: Diagnosis not present

## 2021-08-18 DIAGNOSIS — N390 Urinary tract infection, site not specified: Secondary | ICD-10-CM | POA: Diagnosis not present

## 2021-08-18 DIAGNOSIS — G4733 Obstructive sleep apnea (adult) (pediatric): Secondary | ICD-10-CM | POA: Diagnosis not present

## 2021-08-18 DIAGNOSIS — E1169 Type 2 diabetes mellitus with other specified complication: Secondary | ICD-10-CM | POA: Diagnosis not present

## 2021-08-18 DIAGNOSIS — I10 Essential (primary) hypertension: Secondary | ICD-10-CM | POA: Diagnosis not present

## 2021-08-18 DIAGNOSIS — M79645 Pain in left finger(s): Secondary | ICD-10-CM | POA: Diagnosis not present

## 2021-08-18 DIAGNOSIS — I700 Atherosclerosis of aorta: Secondary | ICD-10-CM | POA: Diagnosis not present

## 2021-08-18 DIAGNOSIS — Z5181 Encounter for therapeutic drug level monitoring: Secondary | ICD-10-CM | POA: Diagnosis not present

## 2021-08-18 DIAGNOSIS — K219 Gastro-esophageal reflux disease without esophagitis: Secondary | ICD-10-CM | POA: Diagnosis not present

## 2021-08-25 DIAGNOSIS — M65312 Trigger thumb, left thumb: Secondary | ICD-10-CM | POA: Diagnosis not present

## 2021-08-25 DIAGNOSIS — M65331 Trigger finger, right middle finger: Secondary | ICD-10-CM | POA: Diagnosis not present

## 2021-08-25 DIAGNOSIS — Z01411 Encounter for gynecological examination (general) (routine) with abnormal findings: Secondary | ICD-10-CM | POA: Diagnosis not present

## 2021-08-25 DIAGNOSIS — Z01419 Encounter for gynecological examination (general) (routine) without abnormal findings: Secondary | ICD-10-CM | POA: Diagnosis not present

## 2021-08-25 DIAGNOSIS — Z1231 Encounter for screening mammogram for malignant neoplasm of breast: Secondary | ICD-10-CM | POA: Diagnosis not present

## 2021-08-25 DIAGNOSIS — Z6831 Body mass index (BMI) 31.0-31.9, adult: Secondary | ICD-10-CM | POA: Diagnosis not present

## 2021-08-25 DIAGNOSIS — N952 Postmenopausal atrophic vaginitis: Secondary | ICD-10-CM | POA: Diagnosis not present

## 2021-08-25 DIAGNOSIS — Z124 Encounter for screening for malignant neoplasm of cervix: Secondary | ICD-10-CM | POA: Diagnosis not present

## 2021-08-25 DIAGNOSIS — R32 Unspecified urinary incontinence: Secondary | ICD-10-CM | POA: Diagnosis not present

## 2021-08-27 ENCOUNTER — Encounter: Payer: Self-pay | Admitting: Adult Health

## 2021-09-09 DIAGNOSIS — Z20822 Contact with and (suspected) exposure to covid-19: Secondary | ICD-10-CM | POA: Diagnosis not present

## 2021-09-20 ENCOUNTER — Other Ambulatory Visit: Payer: Self-pay

## 2021-09-20 ENCOUNTER — Emergency Department (HOSPITAL_BASED_OUTPATIENT_CLINIC_OR_DEPARTMENT_OTHER)
Admission: EM | Admit: 2021-09-20 | Discharge: 2021-09-20 | Disposition: A | Discharge status: Home / Self Care | Payer: Medicare Other | Attending: Emergency Medicine | Admitting: Emergency Medicine

## 2021-09-20 ENCOUNTER — Encounter (HOSPITAL_BASED_OUTPATIENT_CLINIC_OR_DEPARTMENT_OTHER): Payer: Self-pay | Admitting: Emergency Medicine

## 2021-09-20 ENCOUNTER — Emergency Department (HOSPITAL_BASED_OUTPATIENT_CLINIC_OR_DEPARTMENT_OTHER): Payer: Medicare Other

## 2021-09-20 DIAGNOSIS — S0083XA Contusion of other part of head, initial encounter: Secondary | ICD-10-CM | POA: Diagnosis not present

## 2021-09-20 DIAGNOSIS — Y9389 Activity, other specified: Secondary | ICD-10-CM | POA: Insufficient documentation

## 2021-09-20 DIAGNOSIS — J029 Acute pharyngitis, unspecified: Secondary | ICD-10-CM | POA: Insufficient documentation

## 2021-09-20 DIAGNOSIS — W01198A Fall on same level from slipping, tripping and stumbling with subsequent striking against other object, initial encounter: Secondary | ICD-10-CM | POA: Diagnosis not present

## 2021-09-20 DIAGNOSIS — Z7982 Long term (current) use of aspirin: Secondary | ICD-10-CM | POA: Insufficient documentation

## 2021-09-20 DIAGNOSIS — Z79899 Other long term (current) drug therapy: Secondary | ICD-10-CM | POA: Diagnosis not present

## 2021-09-20 DIAGNOSIS — Z20822 Contact with and (suspected) exposure to covid-19: Secondary | ICD-10-CM | POA: Diagnosis not present

## 2021-09-20 DIAGNOSIS — R9431 Abnormal electrocardiogram [ECG] [EKG]: Secondary | ICD-10-CM | POA: Diagnosis not present

## 2021-09-20 DIAGNOSIS — J040 Acute laryngitis: Secondary | ICD-10-CM

## 2021-09-20 DIAGNOSIS — Y92009 Unspecified place in unspecified non-institutional (private) residence as the place of occurrence of the external cause: Secondary | ICD-10-CM | POA: Insufficient documentation

## 2021-09-20 DIAGNOSIS — S0990XA Unspecified injury of head, initial encounter: Secondary | ICD-10-CM | POA: Diagnosis not present

## 2021-09-20 LAB — RESP PANEL BY RT-PCR (FLU A&B, COVID) ARPGX2
Influenza A by PCR: NEGATIVE
Influenza B by PCR: NEGATIVE
SARS Coronavirus 2 by RT PCR: NEGATIVE

## 2021-09-20 LAB — GROUP A STREP BY PCR: Group A Strep by PCR: NOT DETECTED

## 2021-09-20 NOTE — ED Triage Notes (Signed)
Patient brought in by family with c/o fall with head injury today at 9 am. Patient also c/o  sore throat x 2 days. ?

## 2021-09-20 NOTE — ED Provider Notes (Addendum)
?[facility] ?Provider Note ? ? ?CSN: [id number] ?Arrival date & time: 09/20/21  1325 ? ?  ? ?History ? ?Chief Complaint  ?Patient presents with  ? Fall  ? ? ?Karen Blankenship is a 88 y.o. female. ? ?88 year old female presents after having a fall at home.  Patient states she was bending over and lost her balance and fell and struck the left side of her head.  No LOC but does have a hematoma to the left forehead.  Patient had been complaining of a sore throat for several days and now has lost her voice.  Has had URI symptoms denies any fever, vomiting, diarrhea.  Since her fall, she has not been confused.  Has been her baseline.  Is not been ataxic. ? ? ?  ? ?Home Medications ?Prior to Admission medications   ?Medication Sig Start Date End Date Taking? Authorizing Provider  ?alendronate (FOSAMAX) 70 MG tablet Take 70 mg by mouth once a week. Take with a full glass of water on an empty stomach.    [provider]  ?aspirin 81 MG chewable tablet Chew 81 mg by mouth daily.    [provider]  ?Boswellia-Glucosamine-Vit D (OSTEO BI-FLEX ONE PER DAY PO) Take 2 tablets by mouth daily.    [provider]  ?brimonidine (ALPHAGAN) 0.2 % ophthalmic solution 3 (three) times daily.    [provider]  ?cephALEXin (KEFLEX) 500 MG capsule Take 500 mg by mouth in the morning and at bedtime. For 3 days as needed for UTIs    [provider]  ?gabapentin (NEURONTIN) 300 MG capsule Take 300 mg by mouth 3 (three) times daily. Patient takes one pill every 8 hours.    [provider]  ?ibuprofen (ADVIL,MOTRIN) 600 MG tablet Take 600 mg by mouth every 6 (six) hours as needed for pain.    [provider]  ?lisinopril-hydrochlorothiazide (ZESTORETIC) 20-12.5 MG tablet Take 2 tablets by mouth daily. 02/26/21   Minus Breeding, MD  ?Multiple Vitamins-Minerals (CENTRUM SILVER PO) Take by mouth.    [provider]  ?Omega-3 Fatty Acids (FISH OIL) 500 MG  CAPS Take by mouth.    [provider]  ?omeprazole (PRILOSEC) 20 MG capsule Take 20 mg by mouth daily.    [provider]  ?Timolol Maleate PF 0.25 % SOLN Apply to eye.    [provider]  ?   ? ?Allergies    ?Quinine derivatives, Sulfa antibiotics, and Tramadol   ? ?Review of Systems   ?Review of Systems  ?All other systems reviewed and are negative. ? ?Physical Exam ?Updated Vital Signs ?BP 138/69 (BP Location: Right Arm)   Pulse 68   Temp 98.4 ?F (36.9 ?C)   Resp 14   Ht 1.549 m ('5\' 1"'$ )   Wt 73 kg   SpO2 99%   BMI 30.42 kg/m?  ?Physical Exam ?Vitals and nursing note reviewed.  ?Constitutional:   ?   General: She is not in acute distress. ?   Appearance: Normal appearance. She is well-developed. She is not toxic-appearing.  ?HENT:  ?   Head:  ? ?   Mouth/Throat:  ?   Pharynx: Oropharynx is clear.  ?Eyes:  ?   General: Lids are normal.  ?   Conjunctiva/sclera: Conjunctivae normal.  ?   Pupils: Pupils are equal, round, and reactive to light.  ?Neck:  ?   Thyroid: No thyroid mass.  ?   Trachea: No tracheal deviation.  ?Cardiovascular:  ?  Rate and Rhythm: Normal rate and regular rhythm.  ?   Heart sounds: Normal heart sounds. No murmur heard. ?  No gallop.  ?Pulmonary:  ?   Effort: Pulmonary effort is normal. No respiratory distress.  ?   Breath sounds: Normal breath sounds. No stridor. No decreased breath sounds, wheezing, rhonchi or rales.  ?Abdominal:  ?   General: There is no distension.  ?   Palpations: Abdomen is soft.  ?   Tenderness: There is no abdominal tenderness. There is no rebound.  ?Musculoskeletal:     ?   General: No tenderness. Normal range of motion.  ?   Cervical back: Normal range of motion and neck supple.  ?   Comments: No decreased range of motion in her extremities.  ?Skin: ?   General: Skin is warm and dry.  ?   Findings: No abrasion or rash.  ?Neurological:  ?   General: No focal deficit present.  ?   Mental Status: She is alert and oriented to person,  place, and time. Mental status is at baseline.  ?   GCS: GCS eye subscore is 4. GCS verbal subscore is 5. GCS motor subscore is 6.  ?   Cranial Nerves: No cranial nerve deficit.  ?   Sensory: No sensory deficit.  ?   Motor: Motor function is intact.  ?Psychiatric:     ?   Attention and Perception: Attention normal.     ?   Speech: Speech normal.     ?   Behavior: Behavior normal.  ? ? ?ED Results / Procedures / Treatments   ?Labs ?(all labs ordered are listed, but only abnormal results are displayed) ?Labs Reviewed  ?GROUP A STREP BY PCR  ?RESP PANEL BY RT-PCR (FLU A&B, COVID) [id number]  ? ? ?EKG ?EKG Interpretation ? ?Date/Time:  Saturday September 20 2021 14:34:15 EDT ?Ventricular Rate:  68 ?PR Interval:  211 ?QRS Duration: 88 ?QT Interval:  400 ?QTC Calculation: 426 ?R Axis:   49 ?Text Interpretation: Sinus rhythm Low voltage, precordial leads No significant change since last tracing Confirmed by Lacretia Leigh ([id number]) on 09/20/2021 2:58:34 PM ? ?Radiology ?No results found. ? ?Procedures ?Procedures  ? ? ?Medications Ordered in ED ?Medications - No data to display ? ?ED Course/ Medical Decision Making/ A&P ?  ?                        ?Medical Decision Making ?Amount and/or Complexity of Data Reviewed ?Radiology: ordered. ?ECG/medicine tests: ordered. ? ? ?Patient is EKG per my interpretation shows no sign of acute ischemic changes.  Suspect patient had mechanical fall do not think that this represented syncope.  Patient did have large hematoma and head CT performed and per my interpretation shows no signs of intracranial hemorrhage.  Patient has laryngitis and suspect viral etiology.  Did have a sore throat as well.  COVID flu and strep test are pending.  Discussed with patient's daughter who is known to me agrees to check MyChart for the results.  We will follow-up with her doctor as needed ? ? ? ? ? ? ? ?Final Clinical Impression(s) / ED Diagnoses ?Final diagnoses:  ?None  ? ? ?Rx / DC Orders ?ED Discharge Orders    ? ? None  ? ?  ? ? ?  ?Lacretia Leigh, MD ?09/20/21 1459 ? ?  ?Lacretia Leigh, MD ?09/20/21 1459 ? ?

## 2021-09-20 NOTE — Discharge Instructions (Signed)
Follow-up in MyChart the results of your COVID and flu test along with your strep test ?

## 2021-09-24 DIAGNOSIS — J40 Bronchitis, not specified as acute or chronic: Secondary | ICD-10-CM | POA: Diagnosis not present

## 2021-09-30 ENCOUNTER — Ambulatory Visit: Payer: Medicare Other | Admitting: Physician Assistant

## 2021-10-07 DIAGNOSIS — N3946 Mixed incontinence: Secondary | ICD-10-CM | POA: Diagnosis not present

## 2021-10-11 DIAGNOSIS — Z20822 Contact with and (suspected) exposure to covid-19: Secondary | ICD-10-CM | POA: Diagnosis not present

## 2021-10-13 DIAGNOSIS — M65331 Trigger finger, right middle finger: Secondary | ICD-10-CM | POA: Diagnosis not present

## 2021-10-31 DIAGNOSIS — Z20822 Contact with and (suspected) exposure to covid-19: Secondary | ICD-10-CM | POA: Diagnosis not present

## 2021-10-31 DIAGNOSIS — R059 Cough, unspecified: Secondary | ICD-10-CM | POA: Diagnosis not present

## 2021-10-31 DIAGNOSIS — U071 COVID-19: Secondary | ICD-10-CM | POA: Diagnosis not present

## 2021-10-31 DIAGNOSIS — R52 Pain, unspecified: Secondary | ICD-10-CM | POA: Diagnosis not present

## 2021-10-31 DIAGNOSIS — R509 Fever, unspecified: Secondary | ICD-10-CM | POA: Diagnosis not present

## 2021-11-25 ENCOUNTER — Ambulatory Visit (INDEPENDENT_AMBULATORY_CARE_PROVIDER_SITE_OTHER): Payer: Medicare Other | Admitting: Physician Assistant

## 2021-11-25 ENCOUNTER — Encounter: Payer: Self-pay | Admitting: Physician Assistant

## 2021-11-25 DIAGNOSIS — L720 Epidermal cyst: Secondary | ICD-10-CM

## 2021-11-25 DIAGNOSIS — D485 Neoplasm of uncertain behavior of skin: Secondary | ICD-10-CM

## 2021-11-25 NOTE — Patient Instructions (Signed)

## 2021-11-26 NOTE — Progress Notes (Signed)
   New Patient   Subjective  Karen Blankenship is a 88 y.o. female who presents for the following: Cyst (Here for have cyst removed under right breast ). It was irritated and popped earlier.    The following portions of the chart were reviewed this encounter and updated as appropriate:  Tobacco  Allergies  Meds  Problems  Med Hx  Surg Hx  Fam Hx      Objective  Well appearing patient in no apparent distress; mood and affect are within normal limits.  All skin waist up examined.  Right Inframammary Fold Cystic nodule with surroundidngecchymosis  left upper chest Open comedome with underlying nodule   Assessment & Plan  Neoplasm of uncertain behavior of skin (2) Right Inframammary Fold  Skin / nail biopsy Type of biopsy: punch   Informed consent: discussed and consent obtained   Timeout: patient name, date of birth, surgical site, and procedure verified   Procedure prep:  Patient was prepped and draped in usual sterile fashion (Non sterile) Prep type:  Chlorhexidine Anesthesia: the lesion was anesthetized in a standard fashion   Anesthetic:  1% lidocaine w/ epinephrine 1-100,000 local infiltration Punch size:  8 mm Suture size:  3-0 Suture type: nylon   Hemostasis achieved with: suture and pressure   Outcome: patient tolerated procedure well   Post-procedure details: wound care instructions given   Additional details:  Right breast x 2 suture Left upper chest x 2 suture   Specimen 1 - Surgical pathology Differential Diagnosis: cyst   Check Margins: No  left upper chest  Skin / nail biopsy Type of biopsy: punch   Informed consent: discussed and consent obtained   Timeout: patient name, date of birth, surgical site, and procedure verified   Procedure prep:  Patient was prepped and draped in usual sterile fashion (Non sterile) Prep type:  Chlorhexidine Anesthesia: the lesion was anesthetized in a standard fashion   Anesthetic:  1% lidocaine w/ epinephrine  1-100,000 local infiltration Punch size:  8 mm Suture size:  3-0 Suture type: nylon   Hemostasis achieved with: suture and pressure   Outcome: patient tolerated procedure well   Post-procedure details: sterile dressing applied and wound care instructions given   Dressing type: bandage   Additional details:  Right breast x 2 suture Left upper chest x 2 suture   Specimen 2 - Surgical pathology Differential Diagnosis: cyst   Check Margins: No     I, Leyna Vanderkolk, PA-C, have reviewed all documentation's for this visit.  The documentation on 11/26/21 for the exam, diagnosis, procedures and orders are all accurate and complete.

## 2021-12-15 ENCOUNTER — Telehealth: Payer: Self-pay | Admitting: Physician Assistant

## 2021-12-15 NOTE — Telephone Encounter (Signed)
Pathology to patient daughter (beatrice).

## 2021-12-15 NOTE — Telephone Encounter (Signed)
Patient's daughter, Zylie Mumaw, left message on office voice mail that she was calling for pathology results from [place] last appointment with Robyne Askew, PA-C.

## 2022-01-05 DIAGNOSIS — H524 Presbyopia: Secondary | ICD-10-CM | POA: Diagnosis not present

## 2022-01-05 DIAGNOSIS — H5211 Myopia, right eye: Secondary | ICD-10-CM | POA: Diagnosis not present

## 2022-01-05 DIAGNOSIS — H401112 Primary open-angle glaucoma, right eye, moderate stage: Secondary | ICD-10-CM | POA: Diagnosis not present

## 2022-01-05 DIAGNOSIS — H26491 Other secondary cataract, right eye: Secondary | ICD-10-CM | POA: Diagnosis not present

## 2022-01-05 DIAGNOSIS — H401123 Primary open-angle glaucoma, left eye, severe stage: Secondary | ICD-10-CM | POA: Diagnosis not present

## 2022-01-05 DIAGNOSIS — H52223 Regular astigmatism, bilateral: Secondary | ICD-10-CM | POA: Diagnosis not present

## 2022-01-12 ENCOUNTER — Ambulatory Visit: Payer: Medicare Other | Admitting: Adult Health

## 2022-02-11 ENCOUNTER — Other Ambulatory Visit: Payer: Self-pay | Admitting: Cardiology

## 2022-02-12 NOTE — Progress Notes (Signed)
PATIENT: Karen Blankenship DOB: Nov 27, 1933  REASON FOR VISIT: follow up HISTORY FROM: patient  Chief Complaint  Patient presents with   Follow-up    Pt in 4 with daughter (translator)  Pt here for  CPAP f/u Pt states she is sleeping better Pt  has air leaking from mask  Pt fell in May 2023 with admission  Pt had covid May 2023 no admission       HISTORY OF PRESENT ILLNESS: Today 02/16/22:  Karen Blankenship is a 88 year old female with a history of OSA on CPAP.She returns today for follow-up. Repeats that the mask leaks at time but is not bothering her. She changes out her mask every 15 days. Reports that CPAP is working well. DL is below.     01/06/21: Karen Blankenship is an 88 year old female with a history of obstructive sleep apnea on CPAP.  She returns today for follow-up.  Her daughter is with her and interprets for her.  They report that the CPAP is working well.  She denies any new issues.  She returns today for an evaluation.    07/08/20: Karen Blankenship is an 88 year old female with a history of obstructive sleep apnea on CPAP.  She returns today for follow-up.  Her download indicates that she use her machine nightly for compliance of 100%.  She used her machine greater than 4 hours 25 days for compliance of 83%.  On average she uses her machine 5 hours and 1 minute.  Her residual AHI is 18.1 on 718 cm of water with an EPR of 3.    Leak in the 95th percentile is 35.7 L/min.  Apnea index revealed central was 3.1 and obstructive 3.1, unknown 9.2 the patient's daughter states that she recently changed mask.  When looking at the graph her events have decreased approximately 5 events an hour when she has a good seal.  HISTORY (Copied from Dr.Dohmeier's note) Karen Blankenship is a 88 y.o. year old Other or two or more races female patient seen here as a referral on 02/05/2020 from Dr hochrein for a sleep consultation.  Chief concern according to patient :  pt with daughter, Dr. Cristi Loron, in  rm 10. presents today  for having problems with falling asleep,  averaging about 3 hours of sleep a day and complains of feeling very tired.   I have the pleasure of seeing Karen Blankenship today, a right -handed female of [place] with a possible sleep disorder.  She has a past medical history of  Acquired diabetic polyneuropathy, Aortic atherosclerosis (HCC), Aortic root dilation (HCC), BMI 30.0-30.9,adult, Chronic insomnia ([place]), Frequent UTI, GERD (gastroesophageal reflux disease), Glaucoma, Cataract, Hypercholesterolemia, Hypertension, Hyperthyroidism, Loss of memory,  Osteoporosis, RLS (restless legs syndrome), and Subclavian artery stenosis ([place]).     Sleep relevant medical history: sleep changed 6 years ago, after her husband passed away- urinary frequency,  Reversed her nocturia- GERD- Family medical /sleep history:no other family member with OSA.  Social history:  Patient is retired from Administrator-  and lives in a household with her daughter and her partner.  Family status is widowed, with 2 adult daughters.    Tobacco use; none.  ETOH use ; none ,  Caffeine intake in form of Coffee( 1 cup in AM ) Soda( rare ) Tea ( none ) no energy drinks. Regular exercise in form of walking, balance problems, knee pain, foot neuropathy. .     Sleep habits are as follows: The patient's dinner time  is between 5-6 PM.  The patient goes to bed at 12.30 AM and some days sleeps right away- and continues to sleep for 2-4 hours, wakes for one bathroom break.  She looks at the clock a lot. She is anxious.   The preferred sleep position is laterally, with the support of 2  Soft pillows.  Dreams are reportedly frequent/vivid.  6.30 AM is the usual rise time. The patient wakes up spontaneously.  She reports not feeling refreshed or restored in AM, with symptoms such as dry mouth, morning headaches, and residual fatigue. Naps are not taken.    REVIEW OF SYSTEMS: Out of a complete 14 system review of symptoms, the patient  complains only of the following symptoms, and all other reviewed systems are negative.   ESS 10   ALLERGIES: Allergies  Allergen Reactions   Quinine Derivatives Anaphylaxis   Sulfa Antibiotics Anaphylaxis   Tramadol Itching    HOME MEDICATIONS: Outpatient Medications Prior to Visit  Medication Sig Dispense Refill   alendronate (FOSAMAX) 70 MG tablet Take 70 mg by mouth once a week. Take with a full glass of water on an empty stomach.     aspirin 81 MG chewable tablet Chew 81 mg by mouth daily.     brimonidine (ALPHAGAN) 0.2 % ophthalmic solution 3 (three) times daily.     cephALEXin (KEFLEX) 500 MG capsule Take 500 mg by mouth in the morning and at bedtime. For 3 days as needed for UTIs     Cyanocobalamin (B-12 PO) Take by mouth daily.     gabapentin (NEURONTIN) 300 MG capsule Take 300 mg by mouth 3 (three) times daily. Patient takes one pill every 8 hours.     ibuprofen (ADVIL,MOTRIN) 600 MG tablet Take 600 mg by mouth every 6 (six) hours as needed for pain.     lisinopril-hydrochlorothiazide (ZESTORETIC) 20-12.5 MG tablet Take 2 tablets by mouth once daily 180 tablet 0   Multiple Vitamins-Minerals (CENTRUM SILVER PO) Take by mouth.     Omega-3 Fatty Acids (FISH OIL) 500 MG CAPS Take by mouth.     omeprazole (PRILOSEC) 20 MG capsule Take 20 mg by mouth daily.     Timolol Maleate PF 0.25 % SOLN Apply to eye.     Boswellia-Glucosamine-Vit D (OSTEO BI-FLEX ONE PER DAY PO) Take 2 tablets by mouth daily.     No facility-administered medications prior to visit.    PAST MEDICAL HISTORY: Past Medical History:  Diagnosis Date   Acid reflux    Acquired polyneuropathy    Aortic atherosclerosis (HCC)    Aortic root dilation (HCC)    BMI 30.0-30.9,adult    Chronic insomnia    Diabetic neuropathy (HCC)    DM (diabetes mellitus) (HCC)    Frequent UTI    GERD (gastroesophageal reflux disease)    Glaucoma    Hypercholesterolemia    Hypertension    Hyperthyroidism    Loss of memory     Osteopenia    Osteoporosis    RLS (restless legs syndrome)    Subclavian artery stenosis (HCC)     PAST SURGICAL HISTORY: Past Surgical History:  Procedure Laterality Date   ABDOMINAL HYSTERECTOMY     BLADDER SUSPENSION     CYST REMOVAL TRUNK     KNEE SURGERY      FAMILY HISTORY: Family History  Problem Relation Age of Onset   Heart attack Mother        Died age 60   Diabetes Father  Died in his 62s   Sleep apnea Neg Hx     SOCIAL HISTORY: Social History   Socioeconomic History   Marital status: Widowed    Spouse name: Not on file   Number of children: Not on file   Years of education: Not on file   Highest education level: Not on file  Occupational History   Not on file  Tobacco Use   Smoking status: Never   Smokeless tobacco: Never  Vaping Use   Vaping Use: Never used  Substance and Sexual Activity   Alcohol use: No   Drug use: No   Sexual activity: Not on file  Other Topics Concern   Not on file  Social History Narrative   Lives with daughter.  Two children.     Social Determinants of Health   Financial Resource Strain: Not on file  Food Insecurity: Not on file  Transportation Needs: Not on file  Physical Activity: Not on file  Stress: Not on file  Social Connections: Not on file  Intimate Partner Violence: Not on file      PHYSICAL EXAM  Vitals:   02/16/22 1457  BP: 139/66  Pulse: 66  Weight: 163 lb (73.9 kg)  Height: 5' (1.524 m)    Body mass index is 31.83 kg/m.  Generalized: Well developed, in no acute distress  Chest: Lungs clear to auscultation bilaterally  Neurological examination  Mentation: Alert oriented to time, place, history taking. Follows all commands speech and language fluent Cranial nerve II-XII: Extraocular movements were full, visual field were full on confrontational test Head turning and shoulder shrug  were normal and symmetric. Motor: The motor testing reveals 5 over 5 strength of all 4 extremities.  Good symmetric motor tone is noted throughout.  Sensory: Sensory testing is intact to soft touch on all 4 extremities. No evidence of extinction is noted.  Gait and station: Gait is normal.    DIAGNOSTIC DATA (LABS, IMAGING, TESTING) - I reviewed patient records, labs, notes, testing and imaging myself where available.     ASSESSMENT AND PLAN 88 y.o. year old female  has a past medical history of Acid reflux, Acquired polyneuropathy, Aortic atherosclerosis (HCC), Aortic root dilation (HCC), BMI 30.0-30.9,adult, Chronic insomnia, Diabetic neuropathy ([place]), DM (diabetes mellitus) ([place]), Frequent UTI, GERD (gastroesophageal reflux disease), Glaucoma, Hypercholesterolemia, Hypertension, Hyperthyroidism, Loss of memory, Osteopenia, Osteoporosis, RLS (restless legs syndrome), and Subclavian artery stenosis ([place]). here with:  OSA on CPAP  - CPAP compliance excellent -Residual AHI is in normal range. - Encourage patient to use CPAP nightly and > 4 hours each night - F/U in 6 months or sooner if needed   Ward Givens, MSN, NP-C 02/16/2022, 3:10 PM [facility] [address] [place], [place] [address] [phone]

## 2022-02-16 ENCOUNTER — Ambulatory Visit (INDEPENDENT_AMBULATORY_CARE_PROVIDER_SITE_OTHER): Payer: Medicare Other | Admitting: Adult Health

## 2022-02-16 ENCOUNTER — Encounter: Payer: Self-pay | Admitting: Adult Health

## 2022-02-16 VITALS — BP 139/66 | HR 66 | Ht 60.0 in | Wt 163.0 lb

## 2022-02-16 DIAGNOSIS — G4733 Obstructive sleep apnea (adult) (pediatric): Secondary | ICD-10-CM

## 2022-02-16 DIAGNOSIS — Z9989 Dependence on other enabling machines and devices: Secondary | ICD-10-CM

## 2022-02-16 NOTE — Patient Instructions (Signed)
Continue using CPAP nightly and greater than 4 hours each night °If your symptoms worsen or you develop new symptoms please let us know.  ° °

## 2022-02-20 DIAGNOSIS — E1169 Type 2 diabetes mellitus with other specified complication: Secondary | ICD-10-CM | POA: Diagnosis not present

## 2022-02-20 DIAGNOSIS — I700 Atherosclerosis of aorta: Secondary | ICD-10-CM | POA: Diagnosis not present

## 2022-02-20 DIAGNOSIS — Z0000 Encounter for general adult medical examination without abnormal findings: Secondary | ICD-10-CM | POA: Diagnosis not present

## 2022-02-20 DIAGNOSIS — R202 Paresthesia of skin: Secondary | ICD-10-CM | POA: Diagnosis not present

## 2022-02-20 DIAGNOSIS — G4733 Obstructive sleep apnea (adult) (pediatric): Secondary | ICD-10-CM | POA: Diagnosis not present

## 2022-02-20 DIAGNOSIS — M8580 Other specified disorders of bone density and structure, unspecified site: Secondary | ICD-10-CM | POA: Diagnosis not present

## 2022-02-20 DIAGNOSIS — N390 Urinary tract infection, site not specified: Secondary | ICD-10-CM | POA: Diagnosis not present

## 2022-02-20 DIAGNOSIS — I77819 Aortic ectasia, unspecified site: Secondary | ICD-10-CM | POA: Diagnosis not present

## 2022-02-20 DIAGNOSIS — I10 Essential (primary) hypertension: Secondary | ICD-10-CM | POA: Diagnosis not present

## 2022-02-23 ENCOUNTER — Other Ambulatory Visit: Payer: Self-pay | Admitting: Family Medicine

## 2022-02-23 DIAGNOSIS — H401112 Primary open-angle glaucoma, right eye, moderate stage: Secondary | ICD-10-CM | POA: Diagnosis not present

## 2022-02-23 DIAGNOSIS — M8580 Other specified disorders of bone density and structure, unspecified site: Secondary | ICD-10-CM

## 2022-02-23 DIAGNOSIS — H401123 Primary open-angle glaucoma, left eye, severe stage: Secondary | ICD-10-CM | POA: Diagnosis not present

## 2022-02-23 DIAGNOSIS — Z961 Presence of intraocular lens: Secondary | ICD-10-CM | POA: Diagnosis not present

## 2022-03-15 NOTE — Progress Notes (Unsigned)
Cardiology Office Note   Date:  03/16/2022   ID:  Karen Blankenship, DOB January 20, 1934, MRN [id number]  PCP:  London Pepper, MD  Cardiologist:   Minus Breeding, MD   Chief Complaint  Patient presents with   Heart Murmur       History of Present Illness: Karen Blankenship is a 88 y.o. female who presents for evaluation of chest pain.   She had a negative perfusion study in 2021.  Since I saw her she has done well.  She walks daily with a cane.  She takes care of the dog and does the cooking. The patient denies any new symptoms such as chest discomfort, neck or arm discomfort. There has been no new shortness of breath, PND or orthopnea. There have been no reported palpitations, presyncope or syncope.  Past Medical History:  Diagnosis Date   Acid reflux    Acquired polyneuropathy    Aortic atherosclerosis (HCC)    Aortic root dilation (HCC)    BMI 30.0-30.9,adult    Chronic insomnia    Diabetic neuropathy (HCC)    DM (diabetes mellitus) ([place])    Frequent UTI    GERD (gastroesophageal reflux disease)    Glaucoma    Hypercholesterolemia    Hypertension    Hyperthyroidism    Loss of memory    Osteopenia    Osteoporosis    RLS (restless legs syndrome)    Subclavian artery stenosis (HCC)     Past Surgical History:  Procedure Laterality Date   ABDOMINAL HYSTERECTOMY     BLADDER SUSPENSION     CYST REMOVAL TRUNK     KNEE SURGERY       Current Outpatient Medications  Medication Sig Dispense Refill   alendronate (FOSAMAX) 70 MG tablet Take 70 mg by mouth once a week. Take with a full glass of water on an empty stomach.     aspirin 81 MG chewable tablet Chew 81 mg by mouth daily.     Boswellia-Glucosamine-Vit D (OSTEO BI-FLEX ONE PER DAY PO) Take 2 tablets by mouth daily.     brimonidine (ALPHAGAN) 0.2 % ophthalmic solution 3 (three) times daily.     cephALEXin (KEFLEX) 500 MG capsule Take 500 mg by mouth in the morning and at bedtime. For 3 days as needed for UTIs      Cyanocobalamin (B-12 PO) Take by mouth daily.     gabapentin (NEURONTIN) 300 MG capsule Take 300 mg by mouth 3 (three) times daily. Patient takes one pill every 8 hours.     ibuprofen (ADVIL,MOTRIN) 600 MG tablet Take 600 mg by mouth every 6 (six) hours as needed for pain.     lisinopril-hydrochlorothiazide (ZESTORETIC) 20-12.5 MG tablet Take 2 tablets by mouth once daily 180 tablet 0   metFORMIN (GLUCOPHAGE) 500 MG tablet Take 500 mg by mouth 2 (two) times daily with a meal.     Multiple Vitamins-Minerals (CENTRUM SILVER PO) Take by mouth.     Omega-3 Fatty Acids (FISH OIL) 500 MG CAPS Take by mouth.     omeprazole (PRILOSEC) 20 MG capsule Take 20 mg by mouth daily.     Timolol Maleate PF 0.25 % SOLN Apply to eye.     No current facility-administered medications for this visit.    Allergies:   Quinine derivatives, Sulfa antibiotics, and Tramadol    ROS:  Please see the history of present illness.   Otherwise, review of systems are positive for none.   All other  systems are reviewed and negative.    PHYSICAL EXAM: VS:  BP (!) 156/72   Pulse (!) 58   Ht '5\' 1"'$  (1.549 m)   Wt 163 lb 3.2 oz (74 kg)   SpO2 95%   BMI 30.84 kg/m  , BMI Body mass index is 30.84 kg/m. GENERAL:  Well appearing NECK:  No jugular venous distention, waveform within normal limits, carotid upstroke brisk and symmetric, questionable carotid bruits, no thyromegaly LUNGS:  Clear to auscultation bilaterally CHEST:  Unremarkable HEART:  PMI not displaced or sustained,S1 and S2 within normal limits, no S3, no S4, no clicks, no rubs, 2 out of 6 substernal and left upper sternal border apical systolic murmur, no diastolic murmurs ABD:  Flat, positive bowel sounds normal in frequency in pitch, no bruits, no rebound, no guarding, no midline pulsatile mass, no hepatomegaly, no splenomegaly EXT:  2 plus pulses except diminished left radial, no edema, no cyanosis no clubbing   EKG:  EKG is  ordered today. The ekg ordered  11/30/2020 demonstrates sinus rhythm, rate 58, axis within normal limits, intervals within normal limits, no acute ST-T wave changes.   Recent Labs: No results found for requested labs within last 365 days.    Lipid Panel No results found for: "CHOL", "TRIG", "HDL", "CHOLHDL", "VLDL", "LDLCALC", "LDLDIRECT"    Wt Readings from Last 3 Encounters:  03/16/22 163 lb 3.2 oz (74 kg)  02/16/22 163 lb (73.9 kg)  09/20/21 161 lb (73 kg)      Other studies Reviewed: Additional studies/ records that were reviewed today include: Labs Review of the above records demonstrates:  Please see elsewhere in the note.     ASSESSMENT AND PLAN:  CHEST PAIN:   She is having no further chest pain.  No change in therapy.  INSOMNIA:   She is having this adjusted by Dr. Brett Fairy.    SUBCLAVIAN STENOSIS:    She does have subclavian stenosis.  Given this and the carotid bruits I will check carotid Doppler  HTN: Blood pressure is elevated but she thinks this is unusual.  She is going to keep a blood pressure diary in her right arm.   MURMUR: I will check an echocardiogram for what I suspect is aortic sclerosis.  Current medicines are reviewed at length with the patient today.  The patient does not have concerns regarding medicines.  The following changes have been made:   None  Labs/ tests ordered today include:   Orders Placed This Encounter  Procedures   EKG 12-Lead   ECHOCARDIOGRAM COMPLETE   VAS US CAROTID      Disposition:   FU with me in 1 year.      Signed, Minus Breeding, MD  03/16/2022 8:30 AM    [place]

## 2022-03-16 ENCOUNTER — Ambulatory Visit: Payer: Medicare Other | Attending: Cardiology | Admitting: Cardiology

## 2022-03-16 ENCOUNTER — Encounter: Payer: Self-pay | Admitting: Cardiology

## 2022-03-16 VITALS — BP 156/72 | HR 58 | Ht 61.0 in | Wt 163.2 lb

## 2022-03-16 DIAGNOSIS — I10 Essential (primary) hypertension: Secondary | ICD-10-CM | POA: Insufficient documentation

## 2022-03-16 DIAGNOSIS — I871 Compression of vein: Secondary | ICD-10-CM | POA: Insufficient documentation

## 2022-03-16 DIAGNOSIS — R011 Cardiac murmur, unspecified: Secondary | ICD-10-CM | POA: Diagnosis not present

## 2022-03-16 DIAGNOSIS — R072 Precordial pain: Secondary | ICD-10-CM | POA: Diagnosis not present

## 2022-03-16 NOTE — Patient Instructions (Signed)
Medication Instructions:  The current medical regimen is effective;  continue present plan and medications.  *If you need a refill on your cardiac medications before your next appointment, please call your pharmacy*   Testing/Procedures: Echocardiogram - Your physician has requested that you have an echocardiogram. Echocardiography is a painless test that uses sound waves to create images of your heart. It provides your doctor with information about the size and shape of your heart and how well your heart's chambers and valves are working. This procedure takes approximately one hour. There are no restrictions for this procedure.   Your physician has requested that you have a carotid duplex. This test is an ultrasound of the carotid arteries in your neck. It looks at blood flow through these arteries that supply the brain with blood. Allow one hour for this exam. There are no restrictions or special instructions.    Follow-Up: At [facility], you and your health needs are our priority.  As part of our continuing mission to provide you with exceptional heart care, we have created designated Provider Care Teams.  These Care Teams include your primary Cardiologist (physician) and Advanced Practice Providers (APPs -  Physician Assistants and Nurse Practitioners) who all work together to provide you with the care you need, when you need it.  We recommend signing up for the patient portal called "MyChart".  Sign up information is provided on this After Visit Summary.  MyChart is used to connect with patients for Virtual Visits (Telemedicine).  Patients are able to view lab/test results, encounter notes, upcoming appointments, etc.  Non-urgent messages can be sent to your provider as well.   To learn more about what you can do with MyChart, go to [email or web address].    Your next appointment:   12 month(s)  The format for your next appointment:   In Person  Provider:   Minus Breeding,  MD

## 2022-03-20 DIAGNOSIS — Z23 Encounter for immunization: Secondary | ICD-10-CM | POA: Diagnosis not present

## 2022-04-06 ENCOUNTER — Ambulatory Visit (INDEPENDENT_AMBULATORY_CARE_PROVIDER_SITE_OTHER): Payer: Medicare Other

## 2022-04-06 DIAGNOSIS — I871 Compression of vein: Secondary | ICD-10-CM

## 2022-04-06 DIAGNOSIS — R011 Cardiac murmur, unspecified: Secondary | ICD-10-CM

## 2022-04-06 LAB — ECHOCARDIOGRAM COMPLETE
AR max vel: 1.9 cm2
AV Area VTI: 2 cm2
AV Area mean vel: 1.8 cm2
AV Mean grad: 8 mmHg
AV Peak grad: 14.4 mmHg
Ao pk vel: 1.9 m/s
Area-P 1/2: 2.29 cm2
MV M vel: 5.61 m/s
MV Peak grad: 125.9 mmHg
S' Lateral: 2.52 cm

## 2022-04-29 ENCOUNTER — Other Ambulatory Visit: Payer: Self-pay

## 2022-04-29 ENCOUNTER — Emergency Department (HOSPITAL_BASED_OUTPATIENT_CLINIC_OR_DEPARTMENT_OTHER)
Admission: EM | Admit: 2022-04-29 | Discharge: 2022-04-29 | Disposition: A | Discharge status: Home / Self Care | Payer: Medicare Other | Attending: Emergency Medicine | Admitting: Emergency Medicine

## 2022-04-29 ENCOUNTER — Emergency Department (HOSPITAL_BASED_OUTPATIENT_CLINIC_OR_DEPARTMENT_OTHER): Payer: Medicare Other

## 2022-04-29 ENCOUNTER — Encounter (HOSPITAL_BASED_OUTPATIENT_CLINIC_OR_DEPARTMENT_OTHER): Payer: Self-pay | Admitting: Emergency Medicine

## 2022-04-29 DIAGNOSIS — E1140 Type 2 diabetes mellitus with diabetic neuropathy, unspecified: Secondary | ICD-10-CM | POA: Diagnosis not present

## 2022-04-29 DIAGNOSIS — R109 Unspecified abdominal pain: Secondary | ICD-10-CM

## 2022-04-29 DIAGNOSIS — Z7982 Long term (current) use of aspirin: Secondary | ICD-10-CM | POA: Insufficient documentation

## 2022-04-29 DIAGNOSIS — I10 Essential (primary) hypertension: Secondary | ICD-10-CM | POA: Insufficient documentation

## 2022-04-29 DIAGNOSIS — R1032 Left lower quadrant pain: Secondary | ICD-10-CM | POA: Diagnosis present

## 2022-04-29 DIAGNOSIS — Z79899 Other long term (current) drug therapy: Secondary | ICD-10-CM | POA: Diagnosis not present

## 2022-04-29 DIAGNOSIS — Z7984 Long term (current) use of oral hypoglycemic drugs: Secondary | ICD-10-CM | POA: Diagnosis not present

## 2022-04-29 DIAGNOSIS — K529 Noninfective gastroenteritis and colitis, unspecified: Secondary | ICD-10-CM | POA: Diagnosis not present

## 2022-04-29 DIAGNOSIS — E039 Hypothyroidism, unspecified: Secondary | ICD-10-CM | POA: Insufficient documentation

## 2022-04-29 DIAGNOSIS — K6389 Other specified diseases of intestine: Secondary | ICD-10-CM | POA: Diagnosis not present

## 2022-04-29 LAB — COMPREHENSIVE METABOLIC PANEL
ALT: 14 U/L (ref 0–44)
AST: 17 U/L (ref 15–41)
Albumin: 4.5 g/dL (ref 3.5–5.0)
Alkaline Phosphatase: 63 U/L (ref 38–126)
Anion gap: 10 (ref 5–15)
BUN: 21 mg/dL (ref 8–23)
CO2: 29 mmol/L (ref 22–32)
Calcium: 10.5 mg/dL — ABNORMAL HIGH (ref 8.9–10.3)
Chloride: 98 mmol/L (ref 98–111)
Creatinine, Ser: 0.71 mg/dL (ref 0.44–1.00)
GFR, Estimated: >60 mL/min (ref >60)
Glucose, Bld: 183 mg/dL — ABNORMAL HIGH (ref 70–99)
Potassium: 3.5 mmol/L (ref 3.5–5.1)
Sodium: 137 mmol/L (ref 135–145)
Total Bilirubin: 0.6 mg/dL (ref 0.3–1.2)
Total Protein: 7.8 g/dL (ref 6.5–8.1)

## 2022-04-29 LAB — CBC
HCT: 43.3 % (ref 36.0–46.0)
Hemoglobin: 14.9 g/dL (ref 12.0–15.0)
MCH: 31 pg (ref 26.0–34.0)
MCHC: 34.4 g/dL (ref 30.0–36.0)
MCV: 90 fL (ref 80.0–100.0)
Platelets: 201 10*3/uL (ref 150–400)
RBC: 4.81 MIL/uL (ref 3.87–5.11)
RDW: 12.7 % (ref 11.5–15.5)
WBC: 8.8 10*3/uL (ref 4.0–10.5)
nRBC: 0 % (ref 0.0–0.2)

## 2022-04-29 LAB — LIPASE, BLOOD: Lipase: 10 U/L — ABNORMAL LOW (ref 11–51)

## 2022-04-29 LAB — URINALYSIS, ROUTINE W REFLEX MICROSCOPIC
Bilirubin Urine: NEGATIVE
Glucose, UA: NEGATIVE mg/dL
Hgb urine dipstick: NEGATIVE
Ketones, ur: NEGATIVE mg/dL
Leukocytes,Ua: NEGATIVE
Nitrite: NEGATIVE
Protein, ur: NEGATIVE mg/dL
Specific Gravity, Urine: 1.015 (ref 1.005–1.030)
pH: 6.5 (ref 5.0–8.0)

## 2022-04-29 LAB — LACTIC ACID, PLASMA: Lactic Acid, Venous: 1.4 mmol/L (ref 0.5–1.9)

## 2022-04-29 LAB — OCCULT BLOOD X 1 CARD TO LAB, STOOL: Fecal Occult Bld: POSITIVE — AB

## 2022-04-29 MED ORDER — ACETAMINOPHEN-CODEINE 300-30 MG PO TABS: 1.0000 | ORAL_TABLET | Freq: Four times a day (QID) | ORAL | 0 refills | Status: DC | PRN

## 2022-04-29 MED ORDER — SODIUM CHLORIDE 0.9 % IV BOLUS
1000.0000 mL | Freq: Once | INTRAVENOUS | Status: AC
  Administered 2022-04-29: 1000 mL via INTRAVENOUS

## 2022-04-29 MED ORDER — IOHEXOL 300 MG/ML  SOLN
100.0000 mL | Freq: Once | INTRAMUSCULAR | Status: AC | PRN
  Administered 2022-04-29: 80 mL via INTRAVENOUS

## 2022-04-29 MED ORDER — KETOROLAC TROMETHAMINE 15 MG/ML IJ SOLN
15.0000 mg | Freq: Once | INTRAMUSCULAR | Status: AC
  Administered 2022-04-29: 15 mg via INTRAVENOUS
  Filled 2022-04-29: qty 1

## 2022-04-29 MED ORDER — HYDROMORPHONE HCL 1 MG/ML IJ SOLN
0.5000 mg | Freq: Once | INTRAMUSCULAR | Status: AC
  Administered 2022-04-29: 0.5 mg via INTRAVENOUS
  Filled 2022-04-29: qty 1

## 2022-04-29 MED ORDER — ONDANSETRON HCL 4 MG PO TABS: 4.0000 mg | ORAL_TABLET | ORAL | 0 refills | Status: DC | PRN

## 2022-04-29 MED ORDER — CIPROFLOXACIN HCL 500 MG PO TABS: 500.0000 mg | ORAL_TABLET | Freq: Two times a day (BID) | ORAL | 0 refills | Status: AC

## 2022-04-29 MED ORDER — ONDANSETRON HCL 4 MG/2ML IJ SOLN
4.0000 mg | Freq: Once | INTRAMUSCULAR | Status: AC
  Administered 2022-04-29: 4 mg via INTRAVENOUS
  Filled 2022-04-29: qty 2

## 2022-04-29 NOTE — Discharge Instructions (Addendum)
Recommend clear liquid diet next 5 days Please complete antibiotics Please follow up with pcp in the next few days for re-check If symptoms worsen or do not improve please return to ED  It was a pleasure caring for you today in the emergency department.  Please return to the emergency department for any worsening or worrisome symptoms.

## 2022-04-29 NOTE — ED Notes (Signed)
Patient transported to CT 

## 2022-04-29 NOTE — ED Triage Notes (Signed)
Abdo pain started yesterday. Pain comes and goes.  Today noticed some dark red in the toilet. Indicated pain in the lower abdomen

## 2022-04-29 NOTE — ED Provider Notes (Signed)
[facility] EMERGENCY DEPT Provider Note   CSN: [id number] Arrival date & time: 04/29/22  1612     History  Chief Complaint  Patient presents with   Abdominal Pain    Karen Blankenship is a 88 y.o. female.  Patient as above with significant medical history as below, including diabetic neuropathy, DM, frequent UTI, osteoporosis, hypothyroid, hypertension, HLD who presents to the ED with complaint of abdominal pain.  Patient reports symptoms began yesterday.  Pain primarily suprapubic, described as sharp, stabbing, intermittent.  Unable to identify exacerbating or alleviating factors.  She has not had the similar symptoms in the past.  Denies any vaginal bleeding or discharge that seems abnormal.  No change urination.  No diarrhea.  Did have some red-colored stool earlier today but she recently consumed red-colored Jell-O.  No thinners.  No chest pain or dyspnea, no nausea vomiting associate with her symptoms.     Past Medical History:  Diagnosis Date   Acid reflux    Acquired polyneuropathy    Aortic atherosclerosis (HCC)    Aortic root dilation (HCC)    BMI 30.0-30.9,adult    Chronic insomnia    Diabetic neuropathy (HCC)    DM (diabetes mellitus) ([place])    Frequent UTI    GERD (gastroesophageal reflux disease)    Glaucoma    Hypercholesterolemia    Hypertension    Hyperthyroidism    Loss of memory    Osteopenia    Osteoporosis    RLS (restless legs syndrome)    Subclavian artery stenosis (HCC)     Past Surgical History:  Procedure Laterality Date   ABDOMINAL HYSTERECTOMY     BLADDER SUSPENSION     CYST REMOVAL TRUNK     KNEE SURGERY       The history is provided by a relative and the patient. The history is limited by a language barrier. No language interpreter was used (pt prefers to have family interpret at bedside).  Abdominal Pain Associated symptoms: diarrhea   Associated symptoms: no chest pain, no cough, no dysuria, no fever, no nausea and no  shortness of breath        Home Medications Prior to Admission medications   Medication Sig Start Date End Date Taking? Authorizing Provider  acetaminophen-codeine (TYLENOL #3) 300-30 MG tablet Take 1-2 tablets by mouth every 6 (six) hours as needed for moderate pain. 04/29/22  Yes Wynona Dove A, DO  ciprofloxacin (CIPRO) 500 MG tablet Take 1 tablet (500 mg total) by mouth every 12 (twelve) hours for 5 days. 04/29/22 05/04/22 Yes Wynona Dove A, DO  ondansetron (ZOFRAN) 4 MG tablet Take 1 tablet (4 mg total) by mouth every 4 (four) hours as needed for nausea or vomiting. 04/29/22  Yes Wynona Dove A, DO  alendronate (FOSAMAX) 70 MG tablet Take 70 mg by mouth once a week. Take with a full glass of water on an empty stomach.    [provider]  aspirin 81 MG chewable tablet Chew 81 mg by mouth daily.    [provider]  Boswellia-Glucosamine-Vit D (OSTEO BI-FLEX ONE PER DAY PO) Take 2 tablets by mouth daily.    [provider]  brimonidine (ALPHAGAN) 0.2 % ophthalmic solution 3 (three) times daily.    [provider]  cephALEXin (KEFLEX) 500 MG capsule Take 500 mg by mouth in the morning and at bedtime. For 3 days as needed for UTIs    [provider]  Cyanocobalamin (B-12 PO) Take by mouth daily.  [provider]  gabapentin (NEURONTIN) 300 MG capsule Take 300 mg by mouth 3 (three) times daily. Patient takes one pill every 8 hours.    [provider]  ibuprofen (ADVIL,MOTRIN) 600 MG tablet Take 600 mg by mouth every 6 (six) hours as needed for pain.    [provider]  lisinopril-hydrochlorothiazide (ZESTORETIC) 20-12.5 MG tablet Take 2 tablets by mouth once daily 02/11/22   Minus Breeding, MD  metFORMIN (GLUCOPHAGE) 500 MG tablet Take 500 mg by mouth 2 (two) times daily with a meal.    [provider]  Multiple Vitamins-Minerals (CENTRUM SILVER PO) Take by mouth.    [provider]  Omega-3 Fatty Acids  (FISH OIL) 500 MG CAPS Take by mouth.    [provider]  omeprazole (PRILOSEC) 20 MG capsule Take 20 mg by mouth daily.    [provider]  Timolol Maleate PF 0.25 % SOLN Apply to eye.    [provider]      Allergies    Quinine derivatives, Sulfa antibiotics, and Tramadol    Review of Systems   Review of Systems  Constitutional:  Negative for activity change and fever.  HENT:  Negative for facial swelling and trouble swallowing.   Eyes:  Negative for discharge and redness.  Respiratory:  Negative for cough and shortness of breath.   Cardiovascular:  Negative for chest pain and palpitations.  Gastrointestinal:  Positive for abdominal pain, blood in stool and diarrhea. Negative for nausea.  Genitourinary:  Negative for dysuria and flank pain.  Musculoskeletal:  Negative for back pain and gait problem.  Skin:  Negative for pallor and rash.  Neurological:  Negative for syncope and headaches.    Physical Exam Updated Vital Signs BP (!) 146/53   Pulse (!) 55   Temp (!) 97.5 F (36.4 C)   Resp 16   SpO2 94%  Physical Exam Vitals and nursing note reviewed. Exam conducted with a chaperone present.  Constitutional:      General: She is not in acute distress.    Appearance: Normal appearance. She is well-developed. She is not ill-appearing, toxic-appearing or diaphoretic.  HENT:     Head: Normocephalic and atraumatic.     Right Ear: External ear normal.     Left Ear: External ear normal.     Nose: Nose normal.     Mouth/Throat:     Mouth: Mucous membranes are moist.  Eyes:     General: No scleral icterus.       Right eye: No discharge.        Left eye: No discharge.  Cardiovascular:     Rate and Rhythm: Normal rate and regular rhythm.     Pulses: Normal pulses.     Heart sounds: Normal heart sounds.  Pulmonary:     Effort: Pulmonary effort is normal. No respiratory distress.     Breath sounds: Normal breath sounds.  Abdominal:     General:  Abdomen is flat. There is no distension.     Palpations: Abdomen is soft.     Tenderness: There is abdominal tenderness in the suprapubic area and left lower quadrant. There is no guarding or rebound.  Genitourinary:    Comments: Small external hemorrhoid, does not appear reprolapse.  No frank bleeding.  No palpable internal hemorrhoids, no obvious fissure.  No melena in in rectal vault Musculoskeletal:        General: Normal range of motion.     Cervical back: Normal range of  motion.     Right lower leg: No edema.     Left lower leg: No edema.  Skin:    General: Skin is warm and dry.     Capillary Refill: Capillary refill takes less than 2 seconds.  Neurological:     Mental Status: She is alert. Mental status is at baseline.     GCS: GCS eye subscore is 4. GCS verbal subscore is 5. GCS motor subscore is 6.  Psychiatric:        Mood and Affect: Mood normal.        Behavior: Behavior normal.     ED Results / Procedures / Treatments   Labs (all labs ordered are listed, but only abnormal results are displayed) Labs Reviewed  LIPASE, BLOOD - Abnormal; Notable for the following components:      Result Value   Lipase 10 (*)    All other components within normal limits  COMPREHENSIVE METABOLIC PANEL - Abnormal; Notable for the following components:   Glucose, Bld 183 (*)    Calcium 10.5 (*)    All other components within normal limits  OCCULT BLOOD X 1 CARD TO LAB, STOOL - Abnormal; Notable for the following components:   Fecal Occult Bld POSITIVE (*)    All other components within normal limits  CBC  URINALYSIS, ROUTINE W REFLEX MICROSCOPIC  LACTIC ACID, PLASMA    EKG None  Radiology CT ABDOMEN PELVIS W CONTRAST  Result Date: 04/29/2022 CLINICAL DATA:  Abdominal pain starting today. EXAM: CT ABDOMEN AND PELVIS WITH CONTRAST TECHNIQUE: Multidetector CT imaging of the abdomen and pelvis was performed using the standard protocol following bolus administration of intravenous  contrast. RADIATION DOSE REDUCTION: This exam was performed according to the departmental dose-optimization program which includes automated exposure control, adjustment of the mA and/or kV according to patient size and/or use of iterative reconstruction technique. CONTRAST:  67m OMNIPAQUE IOHEXOL 300 MG/ML  SOLN COMPARISON:  02/10/2016 FINDINGS: Lower chest: Dense mitral valve calcification. Aortic valve calcification. Descending thoracic aortic atherosclerosis. Mild cardiomegaly. Small type 1 hiatal hernia. Stable 4 by 3 mm right lower lobe subpleural nodule, image 10 series 4, lack of change from 2017 indicates a benign process. No further imaging workup of this lesion is indicated. Hepatobiliary: Contracted gallbladder.  Otherwise unremarkable. Pancreas: Unremarkable Spleen: Unremarkable Adrenals/Urinary Tract: 1.4 by 1.2 cm left adrenal nodule, relative washout 72% compatible with adrenal adenoma, and similar size to the 02/10/2016 exam. No further imaging workup of this lesion is indicated. The urinary bladder extends 3.3 cm below the pubococcygeal line, compatible with cystocele. 2.8 by 2.4 cm Bosniak category 1 cyst of the left mid to lower kidney, image 32 series 2. No further imaging workup of this lesion is indicated. Fluid density 1.1 by 0.9 cm left mid kidney Bosniak category 1 cyst noted on image 27 series 2. No further imaging workup of this lesion is indicated. Stomach/Bowel: Low position of the anorectal junction compatible with rectocele. There is substantial abnormal wall thickening involving the entire descending colon, proximally 25 cm segment, with associated paracolic stranding. The wall thickening is most severe proximally as on image 55 of series 5. The transverse colon and sigmoid colon do not appear substantially affect it. No significant diverticulosis in this region. Terminal ileum and appendix normal. No pneumatosis or portal venous gas observed. Vascular/Lymphatic: Atherosclerosis is  present, including aortoiliac atherosclerotic disease. There is some dorsal atheromatous narrowing of the celiac trunk potentially causing mild to moderate degrees of stenosis but no  occlusion. There is also some milder degree of atherosclerotic calcification dorsally at the origin of the superior mesenteric artery which appears otherwise widely patent. No visible filling defect in the SMA or its branches, with the understanding that today's exam was a routine CT abdomen and not a CT angiogram. The inferior mesenteric artery appears patent. Reproductive: Uterus absent.  Adnexa unremarkable. Other: No supplemental non-categorized findings. Musculoskeletal: Grade 1 degenerative anterolisthesis at L4-5 associated with degenerative facet arthropathy at this level. Suspected mild foraminal impingement at L2-3 and L3-4 due to spondylosis and degenerative disc disease. Moderate degenerative hip arthropathy bilaterally. IMPRESSION: 1. 25 cm segment of substantial abnormal wall thickening in the descending colon, with paracolic stranding. Appearance is compatible with colitis. Infectious colitis is favored although ischemic colitis or inflammatory bowel disease are not totally excluded. No pneumatosis, high-grade stenosis of the proximal mesenteric vessels, portal venous gas, visible filling defect in the SMA or its branches, or portal venous gas identified. Correlate with clinical factors such as signs of infection or other indicators such as elevated serum lactate level. 2. Small type 1 hiatal hernia. 3. Dense mitral valve calcification. Aortic valve calcification. Mild cardiomegaly. 4. Small left adrenal adenoma. 5. Cystocele. 6. Mild foraminal impingement at L2-3 and L3-4 due to spondylosis and degenerative disc disease. Grade 1 degenerative anterolisthesis at L4-5. 7. Aortic atherosclerosis. Aortic Atherosclerosis (ICD10-I70.0). Electronically Signed   By: Van Clines M.D.   On: 04/29/2022 18:42     Procedures Procedures    Medications Ordered in ED Medications  sodium chloride 0.9 % bolus 1,000 mL (1,000 mLs Intravenous New Bag/Given 04/29/22 1801)  ketorolac (TORADOL) 15 MG/ML injection 15 mg (15 mg Intravenous Given 04/29/22 1802)  iohexol (OMNIPAQUE) 300 MG/ML solution 100 mL (80 mLs Intravenous Contrast Given 04/29/22 1808)  HYDROmorphone (DILAUDID) injection 0.5 mg (0.5 mg Intravenous Given 04/29/22 1908)  ondansetron (ZOFRAN) injection 4 mg (4 mg Intravenous Given 04/29/22 1908)    ED Course/ Medical Decision Making/ A&P                           Medical Decision Making Amount and/or Complexity of Data Reviewed Labs: ordered. Radiology: ordered.  Risk Prescription drug management.   This patient presents to the ED with chief complaint(s) of abdominal pain, diarrhea, blood in stool with pertinent past medical history of above which further complicates the presenting complaint. The complaint involves an extensive differential diagnosis and also carries with it a high risk of complications and morbidity.    The differential diagnosis includes but not limited to Differential diagnosis includes but is not exclusive to ectopic pregnancy, ovarian cyst, ovarian torsion, acute appendicitis, urinary tract infection, endometriosis, bowel obstruction, hernia, colitis, renal colic, gastroenteritis, volvulus etc. . Serious etiologies were considered.   The initial plan is to screening labs/imaging/analgesics/ivf   Additional history obtained: Additional history obtained from family Records reviewed  prior ED visits, prior labs and imaging, prior PCP visits/documentation  Independent labs interpretation:  The following labs were independently interpreted:  Hemoccult was positive Lactic acid is not elevated.  CBC without significant leukocytosis. No significant anemia.  UA is not consistent with infection.  CMP is stable  Independent visualization of imaging: - I independently  visualized the following imaging with scope of interpretation limited to determining acute life threatening conditions related to emergency care: CTAP, which revealed colitis, likely infectious, less likely ischemic  Cardiac monitoring was reviewed and interpreted by myself which shows NSR  Treatment and  Reassessment:  Toradol, Dilaudid, Zofran, IV fluids >> Symptoms have resolved, she is able to tolerate PO, abd is not peritoneal/soft   Consultation: - Consulted or discussed management/test interpretation w/ external professional: Not applicable  Consideration for admission or further workup: Admission was considered   88 year old female with history as above to the ED with abdominal pain since yesterday, single episode of blood noted in her stool.  Left lower quadrant, suprapubic.  Sharp, stabbing.  Abdomen is soft, nonperitoneal.  Labs reviewed and are stable.  No lactic acidosis.  CT imaging concerning for colitis, likely infectious, less likely ischemic.  Especially given normal lactic acid and nonperitoneal abdomen, WBC is not elevated.  Patient's symptoms have greatly improved after intervention in the ED. family denies any recent travel or suspicious p.o. intake.  We will start patient on empiric Cipro, liquid diet, analgesics for home.  Close outpatient follow-up PCP and follow-up with gastroenterology.  Observation was offered for IV fluids, pain control.  Patient first to trial outpatient therapy at home, daughters will help care for her at home.  She is eager to get home.  The patient improved significantly and was discharged in stable condition. Detailed discussions were had with the patient regarding current findings, and need for close f/u with PCP or on call doctor. The patient has been instructed to return immediately if the symptoms worsen in any way for re-evaluation. Patient verbalized understanding and is in agreement with current care plan. All questions answered prior to  discharge.    Social Determinants of health: Social History   Tobacco Use   Smoking status: Never   Smokeless tobacco: Never  Vaping Use   Vaping Use: Never used  Substance Use Topics   Alcohol use: No   Drug use: No            Final Clinical Impression(s) / ED Diagnoses Final diagnoses:  Colitis  Abdominal pain, unspecified abdominal location    Rx / DC Orders ED Discharge Orders          Ordered    ciprofloxacin (CIPRO) 500 MG tablet  Every 12 hours        04/29/22 2120    ondansetron (ZOFRAN) 4 MG tablet  Every 4 hours PRN        04/29/22 2120    acetaminophen-codeine (TYLENOL #3) 300-30 MG tablet  Every 6 hours PRN        04/29/22 2120              Jeanell Sparrow, DO 04/29/22 2130

## 2022-05-04 DIAGNOSIS — H401112 Primary open-angle glaucoma, right eye, moderate stage: Secondary | ICD-10-CM | POA: Diagnosis not present

## 2022-05-04 DIAGNOSIS — H401123 Primary open-angle glaucoma, left eye, severe stage: Secondary | ICD-10-CM | POA: Diagnosis not present

## 2022-07-21 DIAGNOSIS — M65312 Trigger thumb, left thumb: Secondary | ICD-10-CM | POA: Diagnosis not present

## 2022-08-04 ENCOUNTER — Ambulatory Visit (INDEPENDENT_AMBULATORY_CARE_PROVIDER_SITE_OTHER): Payer: Medicare Other | Admitting: Podiatry

## 2022-08-04 DIAGNOSIS — M2042 Other hammer toe(s) (acquired), left foot: Secondary | ICD-10-CM

## 2022-08-04 DIAGNOSIS — B351 Tinea unguium: Secondary | ICD-10-CM

## 2022-08-04 DIAGNOSIS — M2041 Other hammer toe(s) (acquired), right foot: Secondary | ICD-10-CM

## 2022-08-04 MED ORDER — KETOCONAZOLE 2 % EX CREA: 1.0000 | TOPICAL_CREAM | Freq: Every day | CUTANEOUS | 2 refills | Status: AC

## 2022-08-04 MED ORDER — CICLOPIROX 8 % EX SOLN: Freq: Every day | CUTANEOUS | 2 refills | Status: AC

## 2022-08-04 NOTE — Progress Notes (Signed)
Subjective:   Patient ID: Karen Blankenship, female   DOB: 89 y.o.   MRN: [id number]   HPI Chief Complaint  Patient presents with   foot fungus    Bilateral feet, years, nails are trimmed and smoothed by daughter to control the fungus, kerasal daily foot wash also is applied to the feet     89 year old female presents the office with above concerns.  She says that when she was in [place] the nails will get very dry.  They will get thick and flaky as well.  Her daughter use a Dremel to file them down the PG and Kerasal the toenails for the last 3 days to help.  While back she was placed on medication for nail fungus but given side effects she stopped taking it.  She is interested in topical for nail fungus.  Occasionally she will get symptoms the tips of the toes.  No open lesions.  Review of Systems  All other systems reviewed and are negative.  Past Medical History:  Diagnosis Date   Acid reflux    Acquired polyneuropathy    Aortic atherosclerosis (HCC)    Aortic root dilation (HCC)    BMI 30.0-30.9,adult    Chronic insomnia    Diabetic neuropathy (HCC)    DM (diabetes mellitus) ([place])    Frequent UTI    GERD (gastroesophageal reflux disease)    Glaucoma    Hypercholesterolemia    Hypertension    Hyperthyroidism    Loss of memory    Osteopenia    Osteoporosis    RLS (restless legs syndrome)    Subclavian artery stenosis (HCC)     Past Surgical History:  Procedure Laterality Date   ABDOMINAL HYSTERECTOMY     BLADDER SUSPENSION     CYST REMOVAL TRUNK     KNEE SURGERY       Current Outpatient Medications:    acetaminophen-codeine (TYLENOL #3) 300-30 MG tablet, Take 1-2 tablets by mouth every 6 (six) hours as needed for moderate pain., Disp: 15 tablet, Rfl: 0   alendronate (FOSAMAX) 70 MG tablet, Take 70 mg by mouth once a week. Take with a full glass of water on an empty stomach., Disp: , Rfl:    aspirin 81 MG chewable tablet, Chew 81 mg by mouth daily., Disp: , Rfl:     Boswellia-Glucosamine-Vit D (OSTEO BI-FLEX ONE PER DAY PO), Take 2 tablets by mouth daily., Disp: , Rfl:    brimonidine (ALPHAGAN) 0.2 % ophthalmic solution, 3 (three) times daily., Disp: , Rfl:    cephALEXin (KEFLEX) 500 MG capsule, Take 500 mg by mouth in the morning and at bedtime. For 3 days as needed for UTIs, Disp: , Rfl:    ciclopirox (PENLAC) 8 % solution, Apply topically at bedtime. Apply over nail and surrounding skin. Apply daily over previous coat. After seven (7) days, may remove with alcohol and continue cycle., Disp: 6.6 mL, Rfl: 2   Cyanocobalamin (B-12 PO), Take by mouth daily., Disp: , Rfl:    gabapentin (NEURONTIN) 300 MG capsule, Take 300 mg by mouth 3 (three) times daily. Patient takes one pill every 8 hours., Disp: , Rfl:    ibuprofen (ADVIL,MOTRIN) 600 MG tablet, Take 600 mg by mouth every 6 (six) hours as needed for pain., Disp: , Rfl:    ketoconazole (NIZORAL) 2 % cream, Apply 1 Application topically daily., Disp: 60 g, Rfl: 2   lisinopril-hydrochlorothiazide (ZESTORETIC) 20-12.5 MG tablet, Take 2 tablets by mouth once daily, Disp: 180 tablet,  Rfl: 0   metFORMIN (GLUCOPHAGE) 500 MG tablet, Take 500 mg by mouth 2 (two) times daily with a meal., Disp: , Rfl:    Multiple Vitamins-Minerals (CENTRUM SILVER PO), Take by mouth., Disp: , Rfl:    Omega-3 Fatty Acids (FISH OIL) 500 MG CAPS, Take by mouth., Disp: , Rfl:    omeprazole (PRILOSEC) 20 MG capsule, Take 20 mg by mouth daily., Disp: , Rfl:    ondansetron (ZOFRAN) 4 MG tablet, Take 1 tablet (4 mg total) by mouth every 4 (four) hours as needed for nausea or vomiting., Disp: 6 tablet, Rfl: 0   Timolol Maleate PF 0.25 % SOLN, Apply to eye., Disp: , Rfl:   Allergies  Allergen Reactions   Quinine Derivatives Anaphylaxis   Sulfa Antibiotics Anaphylaxis   Tramadol Itching          Objective:  Physical Exam  General: AAO x3, NAD  Dermatological: Nails are mildly hypertrophic, dystrophic with yellow discoloration of the  nails.  There is no edema, erythema or any signs of infection.  The nails have recently been trimmed by her daughter.  No open lesions.    Vascular: Dorsalis Pedis artery and Posterior Tibial artery pedal pulses are palpable bilateral with immedate capillary fill time.  There is no pain with calf compression, swelling, warmth, erythema.   Neruologic: Grossly intact via light touch bilateral.  Sensation intact with Semmes Weinstein monofilament.  Musculoskeletal: Hammertoes are present.   Gait: Unassisted, Nonantalgic.       Assessment:   89 year old female with onychomycosis     Plan:  -Treatment options discussed including all alternatives, risks, and complications -Etiology of symptoms were discussed -I think not needed debrided nails today.  We discussed different medications to help with nail fungus.  She was to hold off on oral medication.  Prescribed Penlac.  Trula Slade DPM

## 2022-08-04 NOTE — Patient Instructions (Signed)
Ciclopirox Topical Solution What is this medication? CICLOPIROX (sye kloe PEER ox) treats fungal infections of the nails. It belongs to a group of medications called antifungals. It will not treat infections caused by bacteria or viruses. This medicine may be used for other purposes; ask your health care provider or pharmacist if you have questions. COMMON BRAND NAME(S): Ciclodan Nail Solution, CNL8, Penlac What should I tell my care team before I take this medication? They need to know if you have any of these conditions: Diabetes (high blood sugar) Immune system problems Organ transplant Receiving steroid inhalers, cream, or lotion Seizures Tingling of the fingers or toes or other nerve disorder An unusual or allergic reaction to ciclopirox, other medications, foods, dyes, or preservatives Pregnant or trying to get pregnant Breast-feeding How should I use this medication? This medication is for external use only. Do not take by mouth. Wash your hands before and after use. If you are treating your hands, only wash your hands before use. Do not get it in your eyes. If you do, rinse your eyes with plenty of cool tap water. Use it as directed on the prescription label at the same time every day. Do not use it more often than directed. Use the medication for the full course as directed by your care team, even if you think you are better. Do not stop using it unless your care team tells you to stop it early. Apply a thin film of the medication to the affected area. Talk to your care team about the use of this medication in children. While it may be prescribed for children as young as 12 years for selected conditions, precautions do apply. Overdosage: If you think you have taken too much of this medicine contact a poison control center or emergency room at once. NOTE: This medicine is only for you. Do not share this medicine with others. What if I miss a dose? If you miss a dose, use it as soon as  you can. If it is almost time for your next dose, use only that dose. Do not use double or extra doses. What may interact with this medication? Interactions are not expected. Do not use any other skin products without telling your care team. This list may not describe all possible interactions. Give your health care provider a list of all the medicines, herbs, non-prescription drugs, or dietary supplements you use. Also tell them if you smoke, drink alcohol, or use illegal drugs. Some items may interact with your medicine. What should I watch for while using this medication? Visit your care team for regular checks on your progress. It may be some time before you see the benefit from this medication. Do not use nail polish or other nail cosmetic products on the treated nails. Removal of the unattached, infected nail by your care team is needed with use of this medication. If you have diabetes or numbness in your fingers or toes, talk to your care team about proper nail care. What side effects may I notice from receiving this medication? Side effects that you should report to your care team as soon as possible: Allergic reactions--skin rash, itching, hives, swelling of the face, lips, tongue, or throat Burning, itching, crusting, or peeling of treated skin Side effects that usually do not require medical attention (report to your care team if they continue or are bothersome): Change in nail shape, thickness, or color Mild skin irritation, redness, or dryness This list may not describe all possible side   effects. Call your doctor for medical advice about side effects. You may report side effects to FDA at 1-800-FDA-1088. Where should I keep my medication? Keep out of the reach of children and pets. Store at room temperature between 20 and 25 degrees C (68 and 77 degrees F). This medication is flammable. Avoid exposure to heat, fire, flame, and smoking. Get rid of medications that are no longer needed  or have expired: Take the medication to a medication take-back program. Check with your pharmacy or law enforcement to find a location. If you cannot return the medication, check the label or package insert to see if the medication should be thrown out in the garbage or flushed down the toilet. If you are not sure, ask your care team. If it is safe to put in the trash, take the medication out of the container. Mix the medication with cat litter, dirt, coffee grounds, or other unwanted substance. Seal the mixture in a bag or container. Put it in the trash. NOTE: This sheet is a summary. It may not cover all possible information. If you have questions about this medicine, talk to your doctor, pharmacist, or health care provider.  2023 Elsevier/Gold Standard (2021-05-27 00:00:00)  

## 2022-08-21 ENCOUNTER — Ambulatory Visit
Admission: RE | Admit: 2022-08-21 | Discharge: 2022-08-21 | Disposition: A | Discharge status: Home / Self Care | Payer: Medicare Other | Source: Ambulatory Visit | Attending: Family Medicine | Admitting: Family Medicine

## 2022-08-21 DIAGNOSIS — M8580 Other specified disorders of bone density and structure, unspecified site: Secondary | ICD-10-CM

## 2022-08-21 DIAGNOSIS — M8589 Other specified disorders of bone density and structure, multiple sites: Secondary | ICD-10-CM | POA: Diagnosis not present

## 2022-08-22 ENCOUNTER — Other Ambulatory Visit: Payer: Self-pay | Admitting: Cardiology

## 2022-08-24 DIAGNOSIS — I700 Atherosclerosis of aorta: Secondary | ICD-10-CM | POA: Diagnosis not present

## 2022-08-24 DIAGNOSIS — I771 Stricture of artery: Secondary | ICD-10-CM | POA: Diagnosis not present

## 2022-08-24 DIAGNOSIS — M79606 Pain in leg, unspecified: Secondary | ICD-10-CM | POA: Diagnosis not present

## 2022-08-24 DIAGNOSIS — E1140 Type 2 diabetes mellitus with diabetic neuropathy, unspecified: Secondary | ICD-10-CM | POA: Diagnosis not present

## 2022-08-24 DIAGNOSIS — I10 Essential (primary) hypertension: Secondary | ICD-10-CM | POA: Diagnosis not present

## 2022-08-24 DIAGNOSIS — I77819 Aortic ectasia, unspecified site: Secondary | ICD-10-CM | POA: Diagnosis not present

## 2022-08-24 DIAGNOSIS — N390 Urinary tract infection, site not specified: Secondary | ICD-10-CM | POA: Diagnosis not present

## 2022-08-25 ENCOUNTER — Other Ambulatory Visit: Payer: Self-pay | Admitting: Family Medicine

## 2022-08-25 DIAGNOSIS — M79606 Pain in leg, unspecified: Secondary | ICD-10-CM

## 2022-09-28 ENCOUNTER — Ambulatory Visit
Admission: RE | Admit: 2022-09-28 | Discharge: 2022-09-28 | Disposition: A | Discharge status: Home / Self Care | Payer: Medicare Other | Source: Ambulatory Visit | Attending: Family Medicine | Admitting: Family Medicine

## 2022-09-28 DIAGNOSIS — I739 Peripheral vascular disease, unspecified: Secondary | ICD-10-CM | POA: Diagnosis not present

## 2022-09-28 DIAGNOSIS — M79606 Pain in leg, unspecified: Secondary | ICD-10-CM

## 2022-11-03 ENCOUNTER — Encounter: Payer: Medicare Other | Admitting: Vascular Surgery

## 2022-11-06 DIAGNOSIS — Z01411 Encounter for gynecological examination (general) (routine) with abnormal findings: Secondary | ICD-10-CM | POA: Diagnosis not present

## 2022-11-06 DIAGNOSIS — Z124 Encounter for screening for malignant neoplasm of cervix: Secondary | ICD-10-CM | POA: Diagnosis not present

## 2022-11-06 DIAGNOSIS — Z1231 Encounter for screening mammogram for malignant neoplasm of breast: Secondary | ICD-10-CM | POA: Diagnosis not present

## 2022-11-06 DIAGNOSIS — M810 Age-related osteoporosis without current pathological fracture: Secondary | ICD-10-CM | POA: Diagnosis not present

## 2022-11-06 DIAGNOSIS — Z6830 Body mass index (BMI) 30.0-30.9, adult: Secondary | ICD-10-CM | POA: Diagnosis not present

## 2022-11-06 DIAGNOSIS — Z90711 Acquired absence of uterus with remaining cervical stump: Secondary | ICD-10-CM | POA: Diagnosis not present

## 2022-11-06 DIAGNOSIS — Z01419 Encounter for gynecological examination (general) (routine) without abnormal findings: Secondary | ICD-10-CM | POA: Diagnosis not present

## 2022-11-17 ENCOUNTER — Ambulatory Visit (INDEPENDENT_AMBULATORY_CARE_PROVIDER_SITE_OTHER): Payer: Medicare Other | Admitting: Vascular Surgery

## 2022-11-17 ENCOUNTER — Encounter: Payer: Self-pay | Admitting: Vascular Surgery

## 2022-11-17 VITALS — BP 117/71 | HR 61 | Temp 97.4°F | Resp 14 | Ht 61.0 in | Wt 160.0 lb

## 2022-11-17 DIAGNOSIS — I739 Peripheral vascular disease, unspecified: Secondary | ICD-10-CM

## 2022-11-17 NOTE — Progress Notes (Signed)
Patient name: Karen Blankenship MRN: [id number] DOB: 1933/07/27 Sex: female  REASON FOR CONSULT: PAD, abnormal ABI  HPI: Karen Blankenship is a 89 y.o. female, with history of diabetes, hypertension, hyperlipidemia, restless leg syndrome that presents for evaluation of PAD and abnormal ABIs.  Patient is here with her daughter who states she asked the PCP to evaluate her blood flow after watching television.  She underwent exercise arterial studies on 09/28/2022.  Resting ABIs were 1.64 on the right with incomplete postexercise protocol and 1.10 on the left with incomplete exercise protocol.  Segmental exam indicated developing tibial disease.  Patient denies any pain in her leg when she walks.  She has no calf or thigh cramping when she walks.  No rest pain in the feet at night.  No open wounds.  Does not smoke.  No previous vascular inventions.  Past Medical History:  Diagnosis Date   Acid reflux    Acquired polyneuropathy    Aortic atherosclerosis (HCC)    Aortic root dilation (HCC)    BMI 30.0-30.9,adult    Chronic insomnia    Diabetic neuropathy (HCC)    DM (diabetes mellitus) (HCC)    Frequent UTI    GERD (gastroesophageal reflux disease)    Glaucoma    Hypercholesterolemia    Hypertension    Hyperthyroidism    Loss of memory    Osteopenia    Osteoporosis    RLS (restless legs syndrome)    Subclavian artery stenosis (HCC)     Past Surgical History:  Procedure Laterality Date   ABDOMINAL HYSTERECTOMY     BLADDER SUSPENSION     CYST REMOVAL TRUNK     KNEE SURGERY      Family History  Problem Relation Age of Onset   Heart attack Mother        Died age 21   Diabetes Father        Died in his 67s   Sleep apnea Neg Hx     SOCIAL HISTORY: Social History   Socioeconomic History   Marital status: Widowed    Spouse name: Not on file   Number of children: Not on file   Years of education: Not on file   Highest education level: Not on file  Occupational History   Not on  file  Tobacco Use   Smoking status: Never   Smokeless tobacco: Never  Vaping Use   Vaping Use: Never used  Substance and Sexual Activity   Alcohol use: No   Drug use: No   Sexual activity: Not on file  Other Topics Concern   Not on file  Social History Narrative   Lives with daughter.  Two children.     Social Determinants of Health   Financial Resource Strain: Not on file  Food Insecurity: Not on file  Transportation Needs: Not on file  Physical Activity: Not on file  Stress: Not on file  Social Connections: Not on file  Intimate Partner Violence: Not on file    Allergies  Allergen Reactions   Quinine Derivatives Anaphylaxis   Sulfa Antibiotics Anaphylaxis   Tramadol Itching    Current Outpatient Medications  Medication Sig Dispense Refill   acetaminophen-codeine (TYLENOL #3) 300-30 MG tablet Take 1-2 tablets by mouth every 6 (six) hours as needed for moderate pain. 15 tablet 0   alendronate (FOSAMAX) 70 MG tablet Take 70 mg by mouth once a week. Take with a full glass of water on an empty stomach.  aspirin 81 MG chewable tablet Chew 81 mg by mouth daily.     Boswellia-Glucosamine-Vit D (OSTEO BI-FLEX ONE PER DAY PO) Take 2 tablets by mouth daily.     brimonidine (ALPHAGAN) 0.2 % ophthalmic solution 3 (three) times daily.     cephALEXin (KEFLEX) 500 MG capsule Take 500 mg by mouth in the morning and at bedtime. For 3 days as needed for UTIs     ciclopirox (PENLAC) 8 % solution Apply topically at bedtime. Apply over nail and surrounding skin. Apply daily over previous coat. After seven (7) days, may remove with alcohol and continue cycle. 6.6 mL 2   Cyanocobalamin (B-12 PO) Take by mouth daily.     gabapentin (NEURONTIN) 300 MG capsule Take 300 mg by mouth 3 (three) times daily. Patient takes one pill every 8 hours.     ibuprofen (ADVIL,MOTRIN) 600 MG tablet Take 600 mg by mouth every 6 (six) hours as needed for pain.     lisinopril-hydrochlorothiazide (ZESTORETIC)  20-12.5 MG tablet TAKE 2 TABLETS BY MOUTH ONCE DAILY . APPOINTMENT REQUIRED FOR FUTURE REFILLS 180 tablet 3   metFORMIN (GLUCOPHAGE) 500 MG tablet Take 500 mg by mouth 2 (two) times daily with a meal.     Multiple Vitamins-Minerals (CENTRUM SILVER PO) Take by mouth.     omeprazole (PRILOSEC) 20 MG capsule Take 20 mg by mouth daily.     Timolol Maleate PF 0.25 % SOLN Apply to eye.     ketoconazole (NIZORAL) 2 % cream Apply 1 Application topically daily. (Patient not taking: Reported on 11/17/2022) 60 g 2   Omega-3 Fatty Acids (FISH OIL) 500 MG CAPS Take by mouth. (Patient not taking: Reported on 11/17/2022)     ondansetron (ZOFRAN) 4 MG tablet Take 1 tablet (4 mg total) by mouth every 4 (four) hours as needed for nausea or vomiting. (Patient not taking: Reported on 11/17/2022) 6 tablet 0   No current facility-administered medications for this visit.    REVIEW OF SYSTEMS:  [X]  denotes positive finding, [ ]  denotes negative finding Cardiac  Comments:  Chest pain or chest pressure:    Shortness of breath upon exertion:    Short of breath when lying flat:    Irregular heart rhythm:        Vascular    Pain in calf, thigh, or hip brought on by ambulation:    Pain in feet at night that wakes you up from your sleep:     Blood clot in your veins:    Leg swelling:         Pulmonary    Oxygen at home:    Productive cough:     Wheezing:         Neurologic    Sudden weakness in arms or legs:     Sudden numbness in arms or legs:     Sudden onset of difficulty speaking or slurred speech:    Temporary loss of vision in one eye:     Problems with dizziness:         Gastrointestinal    Blood in stool:     Vomited blood:         Genitourinary    Burning when urinating:     Blood in urine:        Psychiatric    Major depression:         Hematologic    Bleeding problems:    Problems with blood clotting too easily:  Skin    Rashes or ulcers:        Constitutional    Fever or  chills:      PHYSICAL EXAM: There were no vitals filed for this visit.  GENERAL: The patient is a well-nourished female, in no acute distress. The vital signs are documented above. CARDIAC: There is a regular rate and rhythm.  VASCULAR:  Bilateral femoral pulses palpable Bilateral popliteal pulses palpable Left DP palpable No palpable right pedal pulses No tissue loss PULMONARY: No respiratory distress. ABDOMEN: Soft and non-tender. MUSCULOSKELETAL: There are no major deformities or cyanosis. NEUROLOGIC: No focal weakness or paresthesias are detected. PSYCHIATRIC: The patient has a normal affect.  DATA:   Exercise arterial studies on 09/28/2022.  Resting ABIs were 1.64 on the right with incomplete postexercise protocol and 1.10 on the left with incomplete exercise protocol.  Segmental exam indicated developing tibial disease with monophasic waveforms.  Assessment/Plan:  89 year old female that presents for evaluation of PAD with abnormal arterial studies.  Her PCP ordered exercise stress test that she was unable to complete.  Her ABIs on the right were noncompressible at 1.64 and normal on the left at 1.10.  Segmental exam indicated developing tibial disease with monophasic waveforms.  She does have palpable popliteal pulses.  That all being said, I do not think she needs any invasive intervention at this time.  Her PAD is asymptomatic.  She does not endorse any classic claudication symptoms and has no rest pain or tissue loss to suggest critical limb ischemia.  I have recommended conservative therapy.  She is on aspirin and statin for risk reduction.  I talked about walking therapies.  I will see her in 1 year with ABIs.  Discussed with her daughter to call with questions or concerns.   Cephus Shelling, MD [facility] Office: [phone]

## 2022-11-26 ENCOUNTER — Other Ambulatory Visit: Payer: Self-pay

## 2022-11-26 DIAGNOSIS — I739 Peripheral vascular disease, unspecified: Secondary | ICD-10-CM

## 2023-02-15 ENCOUNTER — Ambulatory Visit: Payer: Medicare Other | Admitting: Adult Health

## 2023-03-29 DIAGNOSIS — I10 Essential (primary) hypertension: Secondary | ICD-10-CM | POA: Diagnosis not present

## 2023-03-29 DIAGNOSIS — Z23 Encounter for immunization: Secondary | ICD-10-CM | POA: Diagnosis not present

## 2023-03-29 DIAGNOSIS — I77819 Aortic ectasia, unspecified site: Secondary | ICD-10-CM | POA: Diagnosis not present

## 2023-03-29 DIAGNOSIS — Z0000 Encounter for general adult medical examination without abnormal findings: Secondary | ICD-10-CM | POA: Diagnosis not present

## 2023-03-29 DIAGNOSIS — M8580 Other specified disorders of bone density and structure, unspecified site: Secondary | ICD-10-CM | POA: Diagnosis not present

## 2023-03-29 DIAGNOSIS — N390 Urinary tract infection, site not specified: Secondary | ICD-10-CM | POA: Diagnosis not present

## 2023-03-29 DIAGNOSIS — E1169 Type 2 diabetes mellitus with other specified complication: Secondary | ICD-10-CM | POA: Diagnosis not present

## 2023-03-29 DIAGNOSIS — G4733 Obstructive sleep apnea (adult) (pediatric): Secondary | ICD-10-CM | POA: Diagnosis not present

## 2023-03-29 DIAGNOSIS — I700 Atherosclerosis of aorta: Secondary | ICD-10-CM | POA: Diagnosis not present

## 2023-03-30 ENCOUNTER — Ambulatory Visit (INDEPENDENT_AMBULATORY_CARE_PROVIDER_SITE_OTHER): Payer: Medicare Other | Admitting: Adult Health

## 2023-03-30 VITALS — BP 129/57 | HR 58 | Ht 66.0 in | Wt 161.2 lb

## 2023-03-30 DIAGNOSIS — G4700 Insomnia, unspecified: Secondary | ICD-10-CM

## 2023-03-30 DIAGNOSIS — G4733 Obstructive sleep apnea (adult) (pediatric): Secondary | ICD-10-CM

## 2023-03-30 NOTE — Patient Instructions (Signed)
Continue using CPAP nightly and greater than 4 hours each night Can try melatonin 1-3 mg 1.5-2 hours before bedtime  If your symptoms worsen or you develop new symptoms please let us know.

## 2023-04-19 DIAGNOSIS — H401112 Primary open-angle glaucoma, right eye, moderate stage: Secondary | ICD-10-CM | POA: Diagnosis not present

## 2023-04-19 DIAGNOSIS — H401123 Primary open-angle glaucoma, left eye, severe stage: Secondary | ICD-10-CM | POA: Diagnosis not present

## 2023-04-20 DIAGNOSIS — M25511 Pain in right shoulder: Secondary | ICD-10-CM | POA: Diagnosis not present

## 2023-04-20 DIAGNOSIS — L819 Disorder of pigmentation, unspecified: Secondary | ICD-10-CM | POA: Diagnosis not present

## 2023-04-20 DIAGNOSIS — I739 Peripheral vascular disease, unspecified: Secondary | ICD-10-CM | POA: Diagnosis not present

## 2023-05-21 DIAGNOSIS — Z23 Encounter for immunization: Secondary | ICD-10-CM | POA: Diagnosis not present

## 2023-05-24 DIAGNOSIS — M19011 Primary osteoarthritis, right shoulder: Secondary | ICD-10-CM | POA: Diagnosis not present

## 2023-05-24 DIAGNOSIS — M25511 Pain in right shoulder: Secondary | ICD-10-CM | POA: Diagnosis not present

## 2023-06-07 DIAGNOSIS — H401112 Primary open-angle glaucoma, right eye, moderate stage: Secondary | ICD-10-CM | POA: Diagnosis not present

## 2023-06-07 DIAGNOSIS — H401123 Primary open-angle glaucoma, left eye, severe stage: Secondary | ICD-10-CM | POA: Diagnosis not present

## 2023-07-05 DIAGNOSIS — E119 Type 2 diabetes mellitus without complications: Secondary | ICD-10-CM | POA: Diagnosis not present

## 2023-07-05 DIAGNOSIS — H401123 Primary open-angle glaucoma, left eye, severe stage: Secondary | ICD-10-CM | POA: Diagnosis not present

## 2023-07-05 DIAGNOSIS — H401112 Primary open-angle glaucoma, right eye, moderate stage: Secondary | ICD-10-CM | POA: Diagnosis not present

## 2023-07-05 DIAGNOSIS — H47292 Other optic atrophy, left eye: Secondary | ICD-10-CM | POA: Diagnosis not present

## 2023-10-08 DIAGNOSIS — I77819 Aortic ectasia, unspecified site: Secondary | ICD-10-CM | POA: Diagnosis not present

## 2023-10-08 DIAGNOSIS — I771 Stricture of artery: Secondary | ICD-10-CM | POA: Diagnosis not present

## 2023-10-08 DIAGNOSIS — E1140 Type 2 diabetes mellitus with diabetic neuropathy, unspecified: Secondary | ICD-10-CM | POA: Diagnosis not present

## 2023-10-08 DIAGNOSIS — I10 Essential (primary) hypertension: Secondary | ICD-10-CM | POA: Diagnosis not present

## 2023-10-08 DIAGNOSIS — N390 Urinary tract infection, site not specified: Secondary | ICD-10-CM | POA: Diagnosis not present

## 2023-11-01 DIAGNOSIS — H401112 Primary open-angle glaucoma, right eye, moderate stage: Secondary | ICD-10-CM | POA: Diagnosis not present

## 2023-11-01 DIAGNOSIS — H47292 Other optic atrophy, left eye: Secondary | ICD-10-CM | POA: Diagnosis not present

## 2023-11-01 DIAGNOSIS — H401123 Primary open-angle glaucoma, left eye, severe stage: Secondary | ICD-10-CM | POA: Diagnosis not present

## 2023-11-10 IMAGING — CT CT HEAD W/O CM
4 series · 17 of 47 positions shown, 19 images · non-contrast
Comparison: None.

CLINICAL DATA: Trauma



[Series 2: head wo · axial · 0.48mm/px · z∈[-196,-76]mm · 7 of 33 slices shown, 9 images]
[im 5/33  brain]
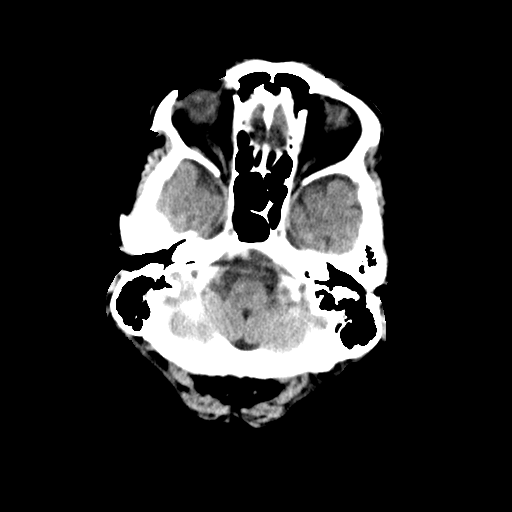
[im 5/33  bone]
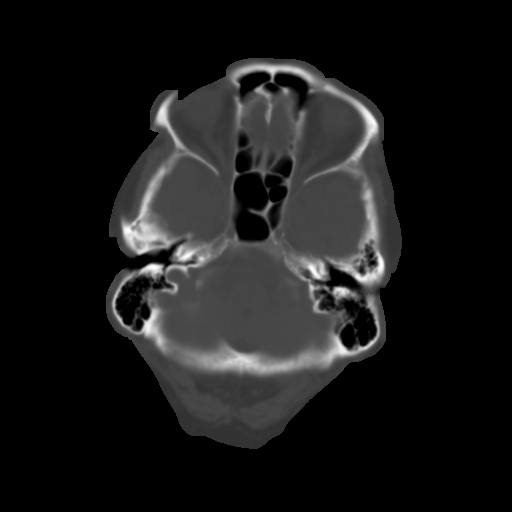
[im 9/33  brain]
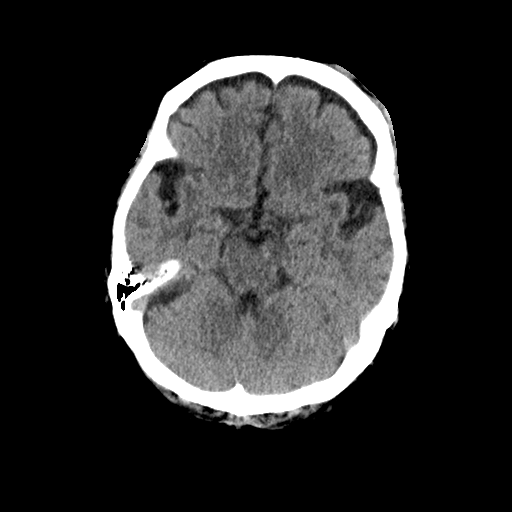
[im 13/33  brain]
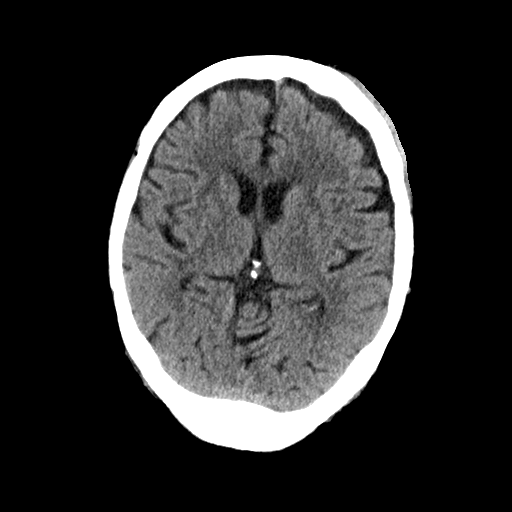
[im 17/33  brain]
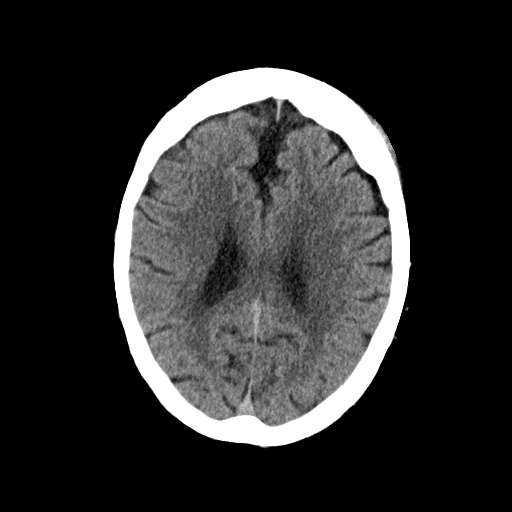
[im 21/33  brain]
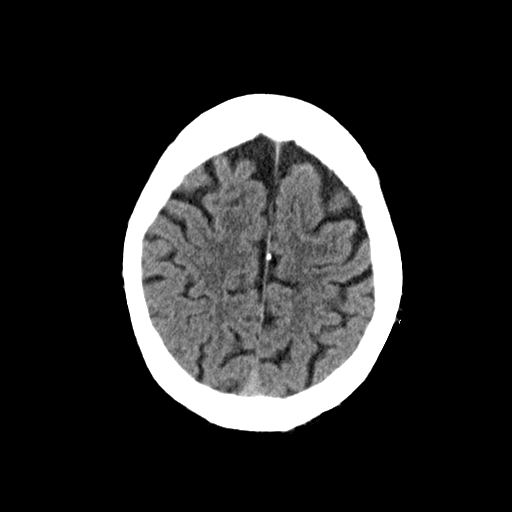
[im 21/33  bone]
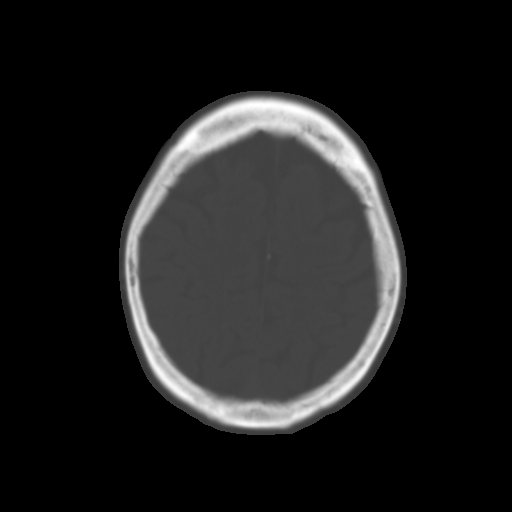
[im 25/33  brain]
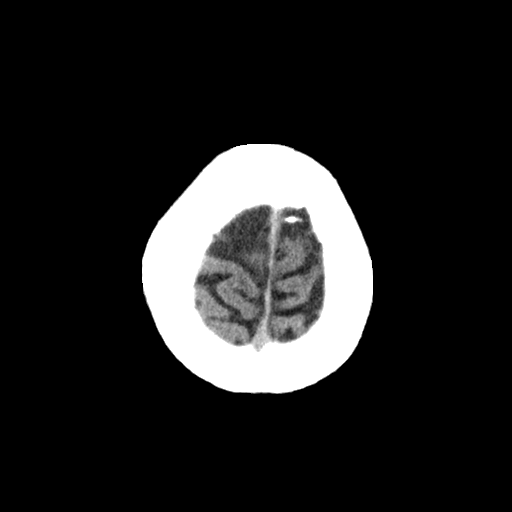
[im 29/33  brain]
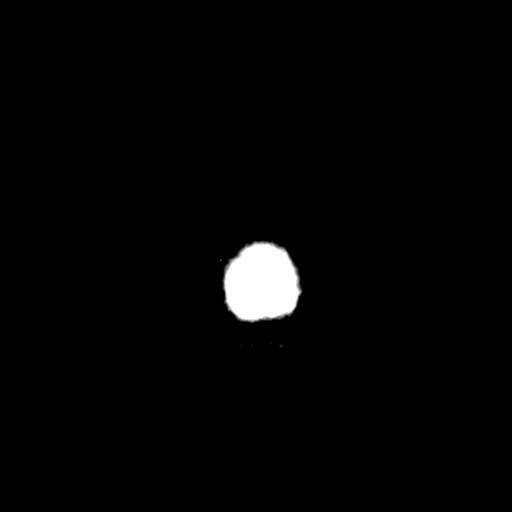

[Series 3: head bone · axial · 0.48mm/px · z∈[-200,-144]mm · 4 of 81 slices shown]
[im 9/81  bone]
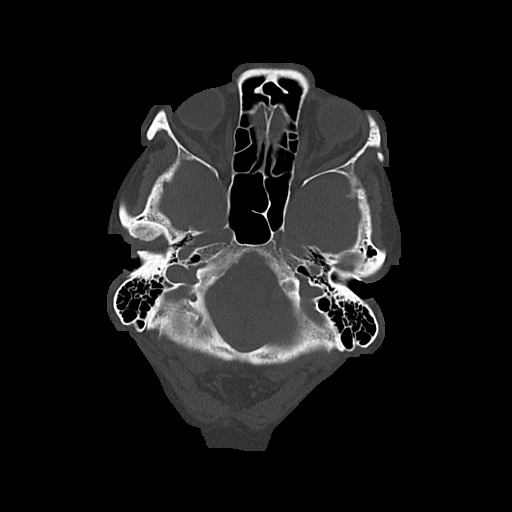
[im 17/81  bone]
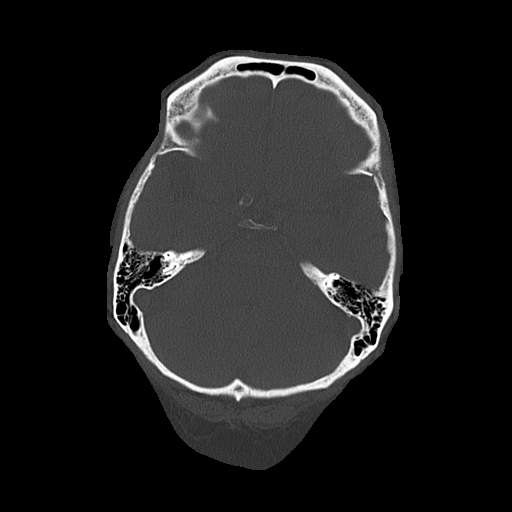
[im 25/81  bone]
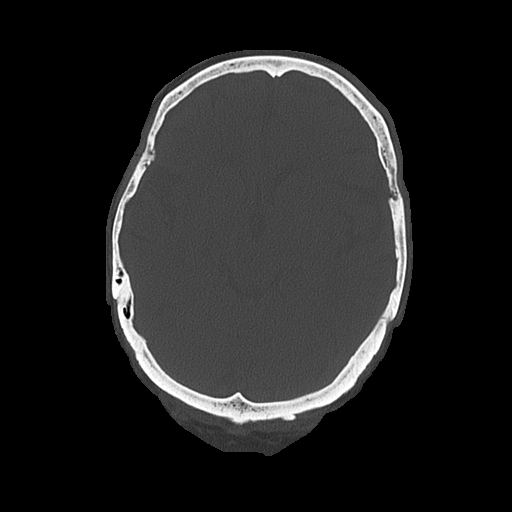
[im 37/81  bone]
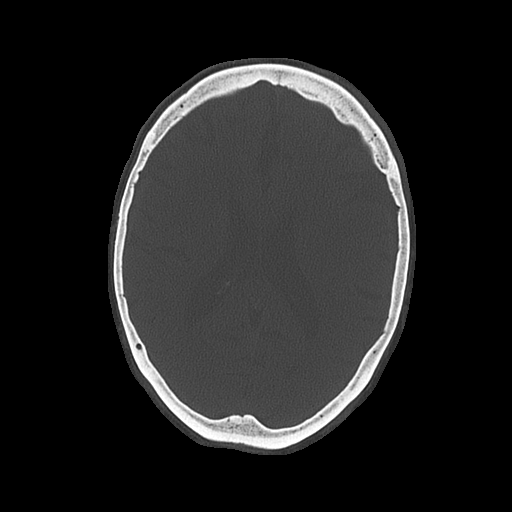

[Series 4: coronal soft · coronal · 0.34mm/px · 3 of 68 slices shown]
[im 23/68  brain]
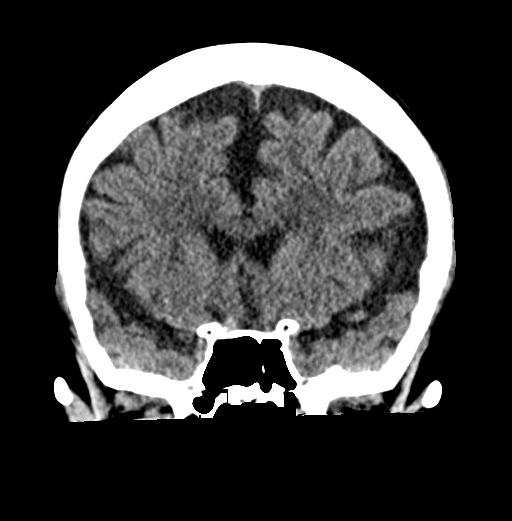
[im 30/68  brain]
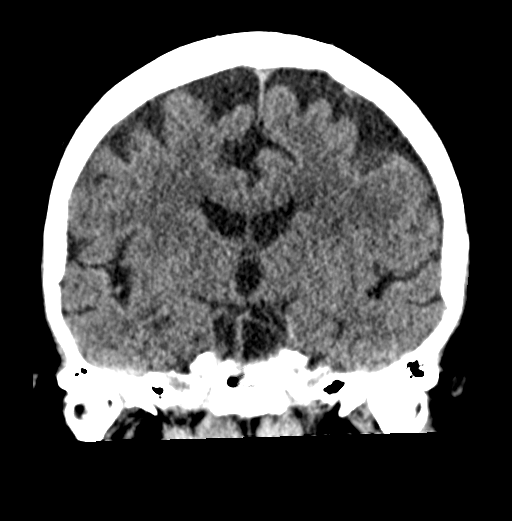
[im 38/68  brain]
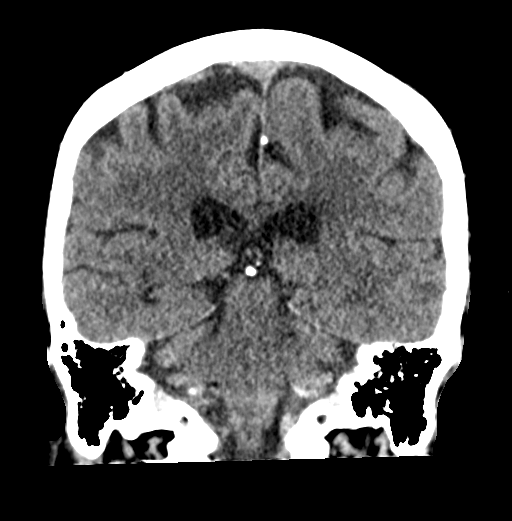

[Series 5: sagittal soft · sagittal · 0.34mm/px · 3 of 58 slices shown]
[im 20/58  brain]
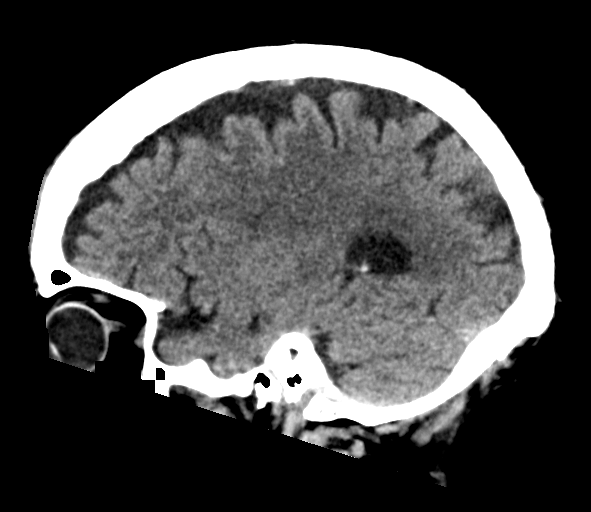
[im 29/58  brain]
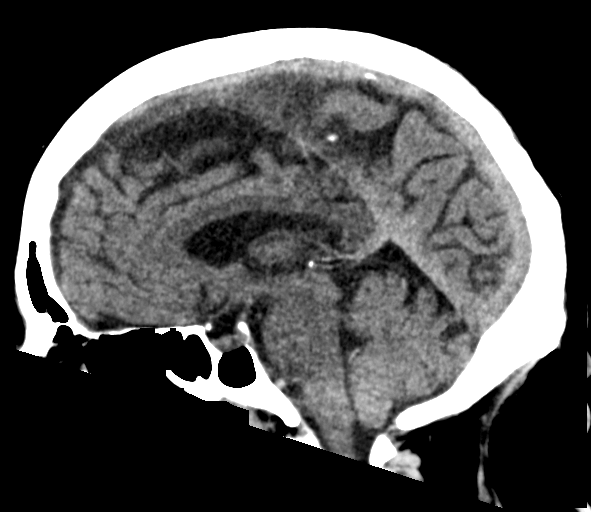
[im 39/58  brain]
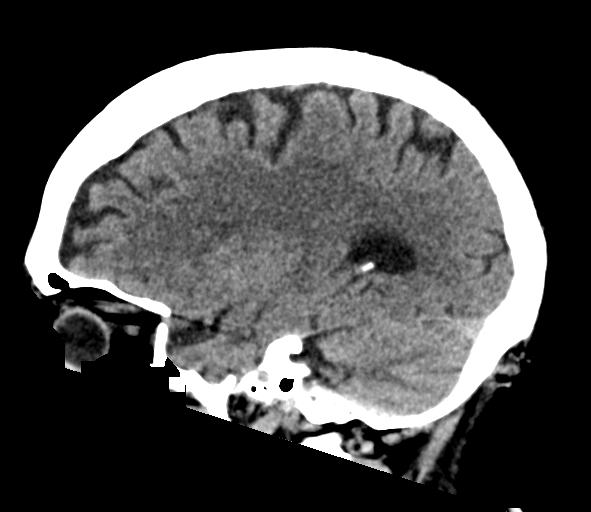

[17 of 47 positions shown; findings below may reference images not displayed]

FINDINGS: Brain: No acute intracranial findings are seen. Ventricles are not
dilated. There is no shift of midline structures. Cortical sulci are
prominent. CSF space in the left frontal region is slightly more
prominent in comparison to the right side. This most likely is due
to asymmetric atrophy. Less likely possibility would be cystic
hygroma. There is no effacement of adjacent cortical sulci.

Vascular: Unremarkable.

Skull: No fracture is seen in the calvarium. There is subcutaneous
hematoma in the left frontal scalp.

Sinuses/Orbits: Unremarkable.

Other: None
IMPRESSION: No acute intracranial findings are seen in noncontrast CT brain.
Atrophy, slightly more so in the left frontal region without any
focal mass effect.

There is subcutaneous hematoma in the left frontal scalp. No
fracture is seen in the calvarium.

## 2023-11-12 DIAGNOSIS — Z1231 Encounter for screening mammogram for malignant neoplasm of breast: Secondary | ICD-10-CM | POA: Diagnosis not present

## 2023-11-12 DIAGNOSIS — Z124 Encounter for screening for malignant neoplasm of cervix: Secondary | ICD-10-CM | POA: Diagnosis not present

## 2023-11-12 DIAGNOSIS — M8580 Other specified disorders of bone density and structure, unspecified site: Secondary | ICD-10-CM | POA: Diagnosis not present

## 2023-11-12 DIAGNOSIS — Z01419 Encounter for gynecological examination (general) (routine) without abnormal findings: Secondary | ICD-10-CM | POA: Diagnosis not present

## 2023-11-12 DIAGNOSIS — N952 Postmenopausal atrophic vaginitis: Secondary | ICD-10-CM | POA: Diagnosis not present

## 2023-11-12 DIAGNOSIS — N390 Urinary tract infection, site not specified: Secondary | ICD-10-CM | POA: Diagnosis not present

## 2023-11-12 DIAGNOSIS — Z01411 Encounter for gynecological examination (general) (routine) with abnormal findings: Secondary | ICD-10-CM | POA: Diagnosis not present

## 2023-11-18 ENCOUNTER — Encounter (INDEPENDENT_AMBULATORY_CARE_PROVIDER_SITE_OTHER): Payer: Self-pay

## 2023-11-19 ENCOUNTER — Other Ambulatory Visit: Payer: Self-pay | Admitting: *Deleted

## 2023-11-19 DIAGNOSIS — I739 Peripheral vascular disease, unspecified: Secondary | ICD-10-CM

## 2023-12-07 ENCOUNTER — Ambulatory Visit (INDEPENDENT_AMBULATORY_CARE_PROVIDER_SITE_OTHER): Admitting: Vascular Surgery

## 2023-12-07 ENCOUNTER — Ambulatory Visit (HOSPITAL_COMMUNITY)
Admission: RE | Admit: 2023-12-07 | Discharge: 2023-12-07 | Disposition: A | Discharge status: Home / Self Care | Source: Ambulatory Visit | Attending: Vascular Surgery | Admitting: Vascular Surgery

## 2023-12-07 ENCOUNTER — Encounter: Payer: Self-pay | Admitting: Vascular Surgery

## 2023-12-07 VITALS — BP 132/66 | HR 56 | Temp 98.0°F | Resp 20 | Ht 66.0 in | Wt 160.6 lb

## 2023-12-07 DIAGNOSIS — I739 Peripheral vascular disease, unspecified: Secondary | ICD-10-CM

## 2023-12-07 LAB — VAS US ABI WITH/WO TBI: Left ABI: 1.16

## 2023-12-07 NOTE — Progress Notes (Signed)
 Patient name: Karen Blankenship MRN: [id number] DOB: 1933-10-01 Sex: female  REASON FOR CONSULT: 1 year follow-up PAD  HPI: Karen Blankenship is a 89 y.o. female, with history of diabetes, hypertension, hyperlipidemia, restless leg syndrome that presents for 1 year follow-up PAD.  Today no pain in her legs when she walks.  No wounds.  No new concerns other than some left ankle swelling.  Previously she underwent exercise arterial studies on 09/28/2022.  Resting ABIs were 1.64 on the right with incomplete postexercise protocol and 1.10 on the left with incomplete exercise protocol.  Segmental exam indicated developing tibial disease.    Past Medical History:  Diagnosis Date   Acid reflux    Acquired polyneuropathy    Aortic atherosclerosis (HCC)    Aortic root dilation (HCC)    BMI 30.0-30.9,adult    Chronic insomnia    Diabetic neuropathy (HCC)    DM (diabetes mellitus) (HCC)    Frequent UTI    GERD (gastroesophageal reflux disease)    Glaucoma    Hypercholesterolemia    Hypertension    Hyperthyroidism    Loss of memory    Osteopenia    Osteoporosis    RLS (restless legs syndrome)    Subclavian artery stenosis (HCC)     Past Surgical History:  Procedure Laterality Date   ABDOMINAL HYSTERECTOMY     BLADDER SUSPENSION     CYST REMOVAL TRUNK     KNEE SURGERY      Family History  Problem Relation Age of Onset   Heart attack Mother        Died age 80   Diabetes Father        Died in his 22s   Sleep apnea Neg Hx     SOCIAL HISTORY: Social History   Socioeconomic History   Marital status: Widowed    Spouse name: Not on file   Number of children: Not on file   Years of education: Not on file   Highest education level: Not on file  Occupational History   Not on file  Tobacco Use   Smoking status: Never   Smokeless tobacco: Never  Vaping Use   Vaping status: Never Used  Substance and Sexual Activity   Alcohol use: No   Drug use: No   Sexual activity: Not on file   Other Topics Concern   Not on file  Social History Narrative   Lives with daughter.  Two children.     Social Drivers of Corporate investment banker Strain: Not on file  Food Insecurity: Not on file  Transportation Needs: Not on file  Physical Activity: Not on file  Stress: Not on file  Social Connections: Not on file  Intimate Partner Violence: Not on file    Allergies  Allergen Reactions   Quinine Derivatives Anaphylaxis   Sulfa Antibiotics Anaphylaxis   Tramadol Itching    Current Outpatient Medications  Medication Sig Dispense Refill   alendronate (FOSAMAX) 70 MG tablet Take 70 mg by mouth once a week. Take with a full glass of water on an empty stomach.     aspirin 81 MG chewable tablet Chew 81 mg by mouth daily.     atorvastatin (LIPITOR) 10 MG tablet Take 10 mg by mouth daily.     cephALEXin (KEFLEX) 500 MG capsule Take 500 mg by mouth in the morning and at bedtime. For 3 days as needed for UTIs (Patient not taking: Reported on 03/30/2023)     cholecalciferol (VITAMIN  D3) 25 MCG (1000 UNIT) tablet Take 1,000 Units by mouth daily.     ciclopirox  (PENLAC ) 8 % solution Apply topically at bedtime. Apply over nail and surrounding skin. Apply daily over previous coat. After seven (7) days, may remove with alcohol and continue cycle. 6.6 mL 2   gabapentin (NEURONTIN) 300 MG capsule Take 300 mg by mouth 3 (three) times daily. Patient takes one pill every 8 hours.     ibuprofen (ADVIL,MOTRIN) 600 MG tablet Take 600 mg by mouth every 6 (six) hours as needed for pain.     ketoconazole  (NIZORAL ) 2 % cream Apply 1 Application topically daily. (Patient not taking: Reported on 11/17/2022) 60 g 2   latanoprost (XALATAN) 0.005 % ophthalmic solution Place 1 drop into both eyes at bedtime.     lisinopril -hydrochlorothiazide  (ZESTORETIC ) 20-12.5 MG tablet TAKE 2 TABLETS BY MOUTH ONCE DAILY . APPOINTMENT REQUIRED FOR FUTURE REFILLS 180 tablet 3   metFORMIN (GLUCOPHAGE) 500 MG tablet Take 500 mg  by mouth 2 (two) times daily with a meal.     omeprazole (PRILOSEC) 20 MG capsule Take 20 mg by mouth daily.     Timolol Maleate PF 0.25 % SOLN Apply to eye.     No current facility-administered medications for this visit.    REVIEW OF SYSTEMS:  [X]  denotes positive finding, [ ]  denotes negative finding Cardiac  Comments:  Chest pain or chest pressure:    Shortness of breath upon exertion:    Short of breath when lying flat:    Irregular heart rhythm:        Vascular    Pain in calf, thigh, or hip brought on by ambulation:    Pain in feet at night that wakes you up from your sleep:     Blood clot in your veins:    Leg swelling:         Pulmonary    Oxygen  at home:    Productive cough:     Wheezing:         Neurologic    Sudden weakness in arms or legs:     Sudden numbness in arms or legs:     Sudden onset of difficulty speaking or slurred speech:    Temporary loss of vision in one eye:     Problems with dizziness:         Gastrointestinal    Blood in stool:     Vomited blood:         Genitourinary    Burning when urinating:     Blood in urine:        Psychiatric    Major depression:         Hematologic    Bleeding problems:    Problems with blood clotting too easily:        Skin    Rashes or ulcers:        Constitutional    Fever or chills:      PHYSICAL EXAM: There were no vitals filed for this visit.  GENERAL: The patient is a well-nourished female, in no acute distress. The vital signs are documented above. CARDIAC: There is a regular rate and rhythm.  VASCULAR:  Bilateral femoral pulses palpable Bilateral popliteal pulses palpable Left DP palpable No palpable right pedal pulses No tissue loss PULMONARY: No respiratory distress. ABDOMEN: Soft and non-tender. MUSCULOSKELETAL: There are no major deformities or cyanosis. NEUROLOGIC: No focal weakness or paresthesias are detected. PSYCHIATRIC: The patient has a normal affect.  DATA:   ABI today  [place] right and monophasic waveforms and 1.16 left monophasic   Exercise arterial studies on 09/28/2022.  Resting ABIs were 1.64 on the right with incomplete postexercise protocol and 1.10 on the left with incomplete exercise protocol.  Segmental exam indicated developing tibial disease with monophasic waveforms.  Assessment/Plan:  89 year old female that presents for 1 year follow-up PAD.  Last year her PCP ordered exercise stress test that she was unable to complete.  Her ABIs on the right were noncompressible at 1.64 and normal on the left at 1.10.  Segmental exam indicated developing tibial disease with monophasic waveforms.    On 1 year follow-up today she remains asymptomatic from a PAD standpoint.  She has no claudication, no rest pain, no tissue loss.  Continue conservative management.  I will see her in 1 year for ongoing surveillance with ABIs.   Young Hensen, MD [facility] Office: [phone]

## 2024-01-31 ENCOUNTER — Ambulatory Visit (INDEPENDENT_AMBULATORY_CARE_PROVIDER_SITE_OTHER): Admitting: Audiology

## 2024-01-31 ENCOUNTER — Encounter (INDEPENDENT_AMBULATORY_CARE_PROVIDER_SITE_OTHER): Payer: Self-pay | Admitting: Otolaryngology

## 2024-01-31 ENCOUNTER — Ambulatory Visit (INDEPENDENT_AMBULATORY_CARE_PROVIDER_SITE_OTHER): Admitting: Otolaryngology

## 2024-01-31 VITALS — BP 155/77 | HR 55

## 2024-01-31 DIAGNOSIS — H903 Sensorineural hearing loss, bilateral: Secondary | ICD-10-CM

## 2024-01-31 DIAGNOSIS — H6121 Impacted cerumen, right ear: Secondary | ICD-10-CM

## 2024-01-31 NOTE — Progress Notes (Addendum)
  [address] [place], [place] [address] [phone]  Audiological Evaluation    Name: Karen Blankenship     DOB:   Nov 12, 1933      MRN:   [id number]                                                                                     Service Date: 01/31/2024     Accompanied by: daughter   Patient comes today after Dr. Karis, ENT sent a referral for a hearing evaluation due to concerns with hearing loss.  Tested by a Spanish -International aid/development worker.  Symptoms Yes Details  Hearing loss  [x]  Per daughter turns TV volume up and also seems to not be hearing when she walks over to her. [name] says she struggles hearing her soft spoken daughter.   Tinnitus  []    Ear pain/ infections/pressure  []    Balance problems  []    Noise exposure history  []    Previous ear surgeries  []    Family history of hearing loss  []    Amplification  []    Other  []      Otoscopy: Right ear: Difficulty observing eardrum due to wax in her external ear canal Left ear:  Clear external ear canal and notable landmarks visualized on the tympanic membrane.  Tympanometry: Right ear: Type A- Normal external ear canal volume with normal middle ear pressure and tympanic membrane compliance. Left ear: Type A- Normal external ear canal volume with normal middle ear pressure and tympanic membrane compliance.   Pure tone Audiometry: Right ear- Borderline normal to moderate sensorineural hearing loss from 125 Hz - 8000 Hz. Left ear-  Borderline normal to moderate sensorineural hearing loss from 125 Hz - 8000 Hz.  Speech Audiometry: Right ear- Speech Reception Threshold (SRT) was obtained at 30 dBHL. Left ear-Speech Reception Threshold (SRT) was obtained at 35 dBHL.   Word Recognition Score Tested using Spanish list (recorded) Right ear: 96% was obtained at a presentation level of 70 dBHL with contralateral masking which is deemed as  excellent. Left ear: 96% was obtained at a presentation level of 70 dBHL  with contralateral masking which is deemed as  excellent.   The hearing test results were completed under headphones and results are deemed to be of good reliability. Test technique:  conventional     Recommendations: Follow up with ENT as scheduled for today. Return for a hearing evaluation if concerns with hearing changes arise or per MD recommendation. Consider a communication needs assessment after medical clearance for hearing aids is obtained.   Karen Blankenship, AUD

## 2024-02-01 DIAGNOSIS — H6121 Impacted cerumen, right ear: Secondary | ICD-10-CM | POA: Insufficient documentation

## 2024-02-01 DIAGNOSIS — H903 Sensorineural hearing loss, bilateral: Secondary | ICD-10-CM | POA: Insufficient documentation

## 2024-02-01 NOTE — Progress Notes (Signed)
 CC: Progressive hearing loss  HPI:  Karen Blankenship is a 89 y.o. female who presents today with her daughter.  According to the daughter, the patient has been experiencing progressive hearing loss for several years.  The patient denies any otologic difficulty.  She denies any previous ear surgery.  She has no recent otitis media or otitis externa.  She has never worn hearing aids.  Past Medical History:  Diagnosis Date   Acid reflux    Acquired polyneuropathy    Aortic atherosclerosis (HCC)    Aortic root dilation (HCC)    BMI 30.0-30.9,adult    Chronic insomnia    Diabetic neuropathy (HCC)    DM (diabetes mellitus) (HCC)    Frequent UTI    GERD (gastroesophageal reflux disease)    Glaucoma    Hypercholesterolemia    Hypertension    Hyperthyroidism    Loss of memory    Osteopenia    Osteoporosis    RLS (restless legs syndrome)    Subclavian artery stenosis (HCC)     Past Surgical History:  Procedure Laterality Date   ABDOMINAL HYSTERECTOMY     BLADDER SUSPENSION     CYST REMOVAL TRUNK     KNEE SURGERY      Family History  Problem Relation Age of Onset   Heart attack Mother        Died age 13   Diabetes Father        Died in his 55s   Sleep apnea Neg Hx     Social History:  reports that she has never smoked. She has never used smokeless tobacco. She reports that she does not drink alcohol and does not use drugs.  Allergies:  Allergies  Allergen Reactions   Quinine Derivatives Anaphylaxis   Sulfa Antibiotics Anaphylaxis   Tramadol Itching    Prior to Admission medications   Medication Sig Start Date End Date Taking? Authorizing Provider  aspirin 81 MG chewable tablet Chew 81 mg by mouth daily.   Yes [provider]  atorvastatin (LIPITOR) 10 MG tablet Take 10 mg by mouth daily.   Yes [provider]  Calcium 200 MG TABS    Yes [provider]  cholecalciferol (VITAMIN D3) 25 MCG (1000 UNIT) tablet Take 1,000 Units by mouth daily.   Yes  [provider]  ciclopirox  (PENLAC ) 8 % solution Apply topically at bedtime. Apply over nail and surrounding skin. Apply daily over previous coat. After seven (7) days, may remove with alcohol and continue cycle. 08/04/22  Yes Gershon Donnice SAUNDERS, DPM  estradiol (ESTRACE) 0.1 MG/GM vaginal cream 1/2 gram per vagina twice a week 07/26/20  Yes [provider]  gabapentin (NEURONTIN) 300 MG capsule Take 300 mg by mouth 3 (three) times daily. Patient takes one pill every 8 hours.   Yes [provider]  ibuprofen (ADVIL,MOTRIN) 600 MG tablet Take 600 mg by mouth every 6 (six) hours as needed for pain.   Yes [provider]  latanoprost (XALATAN) 0.005 % ophthalmic solution Place 1 drop into both eyes at bedtime.   Yes [provider]  lisinopril -hydrochlorothiazide  (ZESTORETIC ) 20-12.5 MG tablet TAKE 2 TABLETS BY MOUTH ONCE DAILY . APPOINTMENT REQUIRED FOR FUTURE REFILLS 08/24/22  Yes Lavona Agent, MD  metFORMIN (GLUCOPHAGE) 500 MG tablet Take 500 mg by mouth 2 (two) times daily with a meal.   Yes [provider]  omeprazole (PRILOSEC) 20 MG capsule Take 20 mg by mouth daily.   Yes [provider]  Timolol Maleate PF 0.25 % SOLN Apply to eye.   Yes [provider]  alendronate (FOSAMAX) 70 MG tablet Take 70 mg by mouth once a week. Take with a full glass of water on an empty stomach. Patient not taking: Reported on 01/31/2024    [provider]  cephALEXin (KEFLEX) 500 MG capsule Take 500 mg by mouth in the morning and at bedtime. For 3 days as needed for UTIs Patient not taking: Reported on 01/31/2024    [provider]  ketoconazole  (NIZORAL ) 2 % cream Apply 1 Application topically daily. Patient not taking: Reported on 01/31/2024 08/04/22   Gershon Donnice SAUNDERS, DPM    Blood pressure (!) 155/77, pulse (!) 55, SpO2 92%. Exam: General: Communicates without difficulty, well nourished, no acute distress. Head: Normocephalic,  no evidence injury, no tenderness, facial buttresses intact without stepoff. Face/sinus: No tenderness to palpation and percussion. Facial movement is normal and symmetric. Eyes: PERRL, EOMI. No scleral icterus, conjunctivae clear. Neuro: CN II exam reveals vision grossly intact.  No nystagmus at any point of gaze. Ears: Auricles well formed without lesions.  Right ear cerumen impaction.  The left ear canal and tympanic membrane are normal.  Nose: External evaluation reveals normal support and skin without lesions.  Dorsum is intact.  Anterior rhinoscopy reveals congested mucosa over anterior aspect of inferior turbinates and intact septum.  No purulence noted. Oral:  Oral cavity and oropharynx are intact, symmetric, without erythema or edema.  Mucosa is moist without lesions. Neck: Full range of motion without pain.  There is no significant lymphadenopathy.  No masses palpable.  Thyroid  bed within normal limits to palpation.  Parotid glands and submandibular glands equal bilaterally without mass.  Trachea is midline. Neuro:  CN 2-12 grossly intact.   Procedure: Right ear cerumen disimpaction Anesthesia: None Description: Under the operating microscope, the cerumen is carefully removed with a combination of cerumen currette, alligator forceps, and suction catheters.  After the cerumen is removed, the TMs are noted to be normal.  No mass, erythema, or lesions. The patient tolerated the procedure well.    Her hearing test shows bilateral symmetric mild to moderate sensorineural hearing loss.  Assessment: 1.  Right ear cerumen impaction.  After the disimpaction procedure, both tympanic membranes and middle ear spaces are noted to be normal. 2.  Bilateral mild to moderate sensorineural hearing loss.  This is likely secondary to routine presbycusis.  Plan: 1.  Otomicroscopy with right ear cerumen disimpaction. 2.  The physical exam findings and the hearing test results are reviewed with the patient and her  daughter. 3.  The patient is a candidate for hearing amplification.  The hearing aid options are discussed. 4.  The patient will return for reevaluation in 1 year.  Karen Principato W Meda Dudzinski 02/01/2024, 12:00 PM

## 2024-03-06 DIAGNOSIS — H53453 Other localized visual field defect, bilateral: Secondary | ICD-10-CM | POA: Diagnosis not present

## 2024-03-06 DIAGNOSIS — H401112 Primary open-angle glaucoma, right eye, moderate stage: Secondary | ICD-10-CM | POA: Diagnosis not present

## 2024-03-06 DIAGNOSIS — H401123 Primary open-angle glaucoma, left eye, severe stage: Secondary | ICD-10-CM | POA: Diagnosis not present

## 2024-03-06 DIAGNOSIS — H47292 Other optic atrophy, left eye: Secondary | ICD-10-CM | POA: Diagnosis not present

## 2024-03-17 DIAGNOSIS — Z23 Encounter for immunization: Secondary | ICD-10-CM | POA: Diagnosis not present

## 2024-04-02 NOTE — Progress Notes (Unsigned)
 PATIENT: Karen Blankenship DOB: Oct 30, 1933  REASON FOR VISIT: follow up HISTORY FROM: patient  No chief complaint on file.    HISTORY OF PRESENT ILLNESS: Today 04/02/24:  Karen Blankenship is a 89 y.o. female with a history of OSA on CPAP. Returns today for follow-up.        03/30/23: Karen Blankenship is a 89 y.o. female with a history of OSA on CPAP. Returns today for follow-up.  She is here with her daughter.  The daughter does the interpreting.  Reports that CPAP is working well.  Daughter reports that she is still sleepy sometimes throughout the day.  The patient reports that some nights she goes right to sleep other nights it may be 4 AM before she falls asleep.  Also advised that gabapentin could be contributing to sleepiness.  Her download is below       02/16/22: Karen Blankenship is a 89 year old female with a history of OSA on CPAP.She returns today for follow-up. Repeats that the mask leaks at time but is not bothering her. She changes out her mask every 15 days. Reports that CPAP is working well. DL is below.     01/06/21: Karen Blankenship is an 89 year old female with a history of obstructive sleep apnea on CPAP.  She returns today for follow-up.  Her daughter is with her and interprets for her.  They report that the CPAP is working well.  She denies any new issues.  She returns today for an evaluation.    07/08/20: Karen Blankenship is an 89 year old female with a history of obstructive sleep apnea on CPAP.  She returns today for follow-up.  Her download indicates that she use her machine nightly for compliance of 100%.  She used her machine greater than 4 hours 25 days for compliance of 83%.  On average she uses her machine 5 hours and 1 minute.  Her residual AHI is 18.1 on 718 cm of water with an EPR of 3.    Leak in the 95th percentile is 35.7 L/min.  Apnea index revealed central was 3.1 and obstructive 3.1, unknown 9.2 the patient's daughter states that she recently changed mask.  When looking at  the graph her events have decreased approximately 5 events an hour when she has a good seal.  HISTORY (Copied from Dr.Dohmeier's note) Karen Blankenship is a 89 y.o. year old Other or two or more races female patient seen here as a referral on 02/05/2020 from Dr hochrein for a sleep consultation.  Chief concern according to patient :  pt with daughter, Dr. Rim, in  rm 10. presents today for having problems with falling asleep,  averaging about 3 hours of sleep a day and complains of feeling very tired.   I have the pleasure of seeing Karen Blankenship today, a right -handed female of [place]- Descent with a possible sleep disorder.  She has a past medical history of  Acquired diabetic polyneuropathy, Aortic atherosclerosis (HCC), Aortic root dilation (HCC), BMI 30.0-30.9,adult, Chronic insomnia (HCC), Frequent UTI, GERD (gastroesophageal reflux disease), Glaucoma, Cataract, Hypercholesterolemia, Hypertension, Hyperthyroidism, Loss of memory,  Osteoporosis, RLS (restless legs syndrome), and Subclavian artery stenosis (HCC).     Sleep relevant medical history: sleep changed 6 years ago, after her husband passed away- urinary frequency,  Reversed her nocturia- GERD- Family medical /sleep history:no other family member with OSA.  Social history:  Patient is retired from Psychologist, forensic-  and lives in a household with her daughter and her partner.  Family status is widowed, with 2 adult daughters.    Tobacco use; none.  ETOH use ; none ,  Caffeine intake in form of Coffee( 1 cup in AM ) Soda( rare ) Tea ( none ) no energy drinks. Regular exercise in form of walking, balance problems, knee pain, foot neuropathy. .     Sleep habits are as follows: The patient's dinner time is between 5-6 PM.  The patient goes to bed at 12.30 AM and some days sleeps right away- and continues to sleep for 2-4 hours, wakes for one bathroom break.  She looks at the clock a lot. She is anxious.   The preferred sleep position is  laterally, with the support of 2  Soft pillows.  Dreams are reportedly frequent/vivid.  6.30 AM is the usual rise time. The patient wakes up spontaneously.  She reports not feeling refreshed or restored in AM, with symptoms such as dry mouth, morning headaches, and residual fatigue. Naps are not taken.    REVIEW OF SYSTEMS: Out of a complete 14 system review of symptoms, the patient complains only of the following symptoms, and all other reviewed systems are negative.   ESS 15   ALLERGIES: Allergies  Allergen Reactions   Quinine Derivatives Anaphylaxis   Sulfa Antibiotics Anaphylaxis   Tramadol Itching    HOME MEDICATIONS: Outpatient Medications Prior to Visit  Medication Sig Dispense Refill   alendronate (FOSAMAX) 70 MG tablet Take 70 mg by mouth once a week. Take with a full glass of water on an empty stomach. (Patient not taking: Reported on 01/31/2024)     aspirin 81 MG chewable tablet Chew 81 mg by mouth daily.     atorvastatin (LIPITOR) 10 MG tablet Take 10 mg by mouth daily.     Calcium 200 MG TABS      cephALEXin (KEFLEX) 500 MG capsule Take 500 mg by mouth in the morning and at bedtime. For 3 days as needed for UTIs (Patient not taking: Reported on 01/31/2024)     cholecalciferol (VITAMIN D3) 25 MCG (1000 UNIT) tablet Take 1,000 Units by mouth daily.     ciclopirox  (PENLAC ) 8 % solution Apply topically at bedtime. Apply over nail and surrounding skin. Apply daily over previous coat. After seven (7) days, may remove with alcohol and continue cycle. 6.6 mL 2   estradiol (ESTRACE) 0.1 MG/GM vaginal cream 1/2 gram per vagina twice a week     gabapentin (NEURONTIN) 300 MG capsule Take 300 mg by mouth 3 (three) times daily. Patient takes one pill every 8 hours.     ibuprofen (ADVIL,MOTRIN) 600 MG tablet Take 600 mg by mouth every 6 (six) hours as needed for pain.     ketoconazole  (NIZORAL ) 2 % cream Apply 1 Application topically daily. (Patient not taking: Reported on 01/31/2024) 60 g 2    latanoprost (XALATAN) 0.005 % ophthalmic solution Place 1 drop into both eyes at bedtime.     lisinopril -hydrochlorothiazide  (ZESTORETIC ) 20-12.5 MG tablet TAKE 2 TABLETS BY MOUTH ONCE DAILY . APPOINTMENT REQUIRED FOR FUTURE REFILLS 180 tablet 3   metFORMIN (GLUCOPHAGE) 500 MG tablet Take 500 mg by mouth 2 (two) times daily with a meal.     omeprazole (PRILOSEC) 20 MG capsule Take 20 mg by mouth daily.     Timolol Maleate PF 0.25 % SOLN Apply to eye.     No facility-administered medications prior to visit.    PAST MEDICAL HISTORY: Past Medical History:  Diagnosis Date   Acid reflux  Acquired polyneuropathy    Aortic atherosclerosis    Aortic root dilation    BMI 30.0-30.9,adult    Chronic insomnia    Diabetic neuropathy (HCC)    DM (diabetes mellitus) (HCC)    Frequent UTI    GERD (gastroesophageal reflux disease)    Glaucoma    Hypercholesterolemia    Hypertension    Hyperthyroidism    Loss of memory    Osteopenia    Osteoporosis    RLS (restless legs syndrome)    Subclavian artery stenosis     PAST SURGICAL HISTORY: Past Surgical History:  Procedure Laterality Date   ABDOMINAL HYSTERECTOMY     BLADDER SUSPENSION     CYST REMOVAL TRUNK     KNEE SURGERY      FAMILY HISTORY: Family History  Problem Relation Age of Onset   Heart attack Mother        Died age 63   Diabetes Father        Died in his 69s   Sleep apnea Neg Hx     SOCIAL HISTORY: Social History   Socioeconomic History   Marital status: Widowed    Spouse name: Not on file   Number of children: Not on file   Years of education: Not on file   Highest education level: Not on file  Occupational History   Not on file  Tobacco Use   Smoking status: Never   Smokeless tobacco: Never  Vaping Use   Vaping status: Never Used  Substance and Sexual Activity   Alcohol use: No   Drug use: No   Sexual activity: Not on file  Other Topics Concern   Not on file  Social History Narrative   Lives  with daughter.  Two children.     Social Drivers of Corporate investment banker Strain: Not on file  Food Insecurity: Not on file  Transportation Needs: Not on file  Physical Activity: Not on file  Stress: Not on file  Social Connections: Not on file  Intimate Partner Violence: Not on file      PHYSICAL EXAM  There were no vitals filed for this visit.   There is no height or weight on file to calculate BMI.  Generalized: Well developed, in no acute distress  Chest: Lungs clear to auscultation bilaterally  Neurological examination  Mentation: Alert oriented to time, place, history taking. Follows all commands speech and language fluent Cranial nerve II-XII: Facial symmetry noted  DIAGNOSTIC DATA (LABS, IMAGING, TESTING) - I reviewed patient records, labs, notes, testing and imaging myself where available.     ASSESSMENT AND PLAN 89 y.o. year old female  has a past medical history of Acid reflux, Acquired polyneuropathy, Aortic atherosclerosis, Aortic root dilation, BMI 30.0-30.9,adult, Chronic insomnia, Diabetic neuropathy (HCC), DM (diabetes mellitus) (HCC), Frequent UTI, GERD (gastroesophageal reflux disease), Glaucoma, Hypercholesterolemia, Hypertension, Hyperthyroidism, Loss of memory, Osteopenia, Osteoporosis, RLS (restless legs syndrome), and Subclavian artery stenosis. here with:  OSA on CPAP Insomnia  - CPAP compliance excellent - Residual AHI is in normal range. - Encourage patient to use CPAP nightly and > 4 hours each night - Advised that she could try taking over-the-counter melatonin 1 to 3 mg 1-1/2 to 2 hours before bedtime. - F/U in 1 year or sooner if needed   Duwaine Russell, MSN, NP-C 04/02/2024, 3:49 PM [facility] [address] [place], [place] [address] [phone]

## 2024-04-03 ENCOUNTER — Encounter: Payer: Self-pay | Admitting: Adult Health

## 2024-04-03 ENCOUNTER — Ambulatory Visit: Payer: Medicare Other | Admitting: Adult Health

## 2024-04-03 VITALS — BP 132/82 | HR 75 | Ht 60.0 in | Wt 159.2 lb

## 2024-04-03 DIAGNOSIS — G4733 Obstructive sleep apnea (adult) (pediatric): Secondary | ICD-10-CM

## 2024-04-03 NOTE — Patient Instructions (Addendum)
 Continue using CPAP nightly and greater than 4 hours each night Call if mask refitting needed If your symptoms worsen or you develop new symptoms please let us  know.

## 2024-04-24 DIAGNOSIS — M8580 Other specified disorders of bone density and structure, unspecified site: Secondary | ICD-10-CM | POA: Diagnosis not present

## 2024-04-24 DIAGNOSIS — E1169 Type 2 diabetes mellitus with other specified complication: Secondary | ICD-10-CM | POA: Diagnosis not present

## 2024-04-24 DIAGNOSIS — I10 Essential (primary) hypertension: Secondary | ICD-10-CM | POA: Diagnosis not present

## 2024-04-24 DIAGNOSIS — N390 Urinary tract infection, site not specified: Secondary | ICD-10-CM | POA: Diagnosis not present

## 2024-04-24 DIAGNOSIS — G4733 Obstructive sleep apnea (adult) (pediatric): Secondary | ICD-10-CM | POA: Diagnosis not present

## 2024-04-24 DIAGNOSIS — Z0000 Encounter for general adult medical examination without abnormal findings: Secondary | ICD-10-CM | POA: Diagnosis not present

## 2024-04-24 DIAGNOSIS — I77819 Aortic ectasia, unspecified site: Secondary | ICD-10-CM | POA: Diagnosis not present

## 2024-04-30 ENCOUNTER — Encounter (HOSPITAL_BASED_OUTPATIENT_CLINIC_OR_DEPARTMENT_OTHER): Payer: Self-pay

## 2024-04-30 ENCOUNTER — Emergency Department (HOSPITAL_BASED_OUTPATIENT_CLINIC_OR_DEPARTMENT_OTHER)
Admission: EM | Admit: 2024-04-30 | Discharge: 2024-05-01 | Disposition: A | Discharge status: Home / Self Care | Attending: Emergency Medicine | Admitting: Emergency Medicine

## 2024-04-30 ENCOUNTER — Other Ambulatory Visit: Payer: Self-pay

## 2024-04-30 DIAGNOSIS — I10 Essential (primary) hypertension: Secondary | ICD-10-CM | POA: Insufficient documentation

## 2024-04-30 DIAGNOSIS — E119 Type 2 diabetes mellitus without complications: Secondary | ICD-10-CM | POA: Insufficient documentation

## 2024-04-30 DIAGNOSIS — Z7982 Long term (current) use of aspirin: Secondary | ICD-10-CM | POA: Diagnosis not present

## 2024-04-30 DIAGNOSIS — S29091A Other injury of muscle and tendon of front wall of thorax, initial encounter: Secondary | ICD-10-CM | POA: Diagnosis present

## 2024-04-30 DIAGNOSIS — S20211A Contusion of right front wall of thorax, initial encounter: Secondary | ICD-10-CM | POA: Diagnosis not present

## 2024-04-30 DIAGNOSIS — W01198A Fall on same level from slipping, tripping and stumbling with subsequent striking against other object, initial encounter: Secondary | ICD-10-CM | POA: Diagnosis not present

## 2024-04-30 NOTE — ED Triage Notes (Signed)
 Family used as interpreter.   Reports a fall outdoors 2 da ys ago while daughter was away. Then tonight fell again in the shower.   C/o right rib/ RUQ pain. Request eval for broken ribs.

## 2024-05-01 ENCOUNTER — Emergency Department (HOSPITAL_BASED_OUTPATIENT_CLINIC_OR_DEPARTMENT_OTHER)

## 2024-05-01 DIAGNOSIS — I700 Atherosclerosis of aorta: Secondary | ICD-10-CM | POA: Diagnosis not present

## 2024-05-01 DIAGNOSIS — I517 Cardiomegaly: Secondary | ICD-10-CM | POA: Diagnosis not present

## 2024-05-01 DIAGNOSIS — W19XXXA Unspecified fall, initial encounter: Secondary | ICD-10-CM | POA: Diagnosis not present

## 2024-05-01 DIAGNOSIS — Z043 Encounter for examination and observation following other accident: Secondary | ICD-10-CM | POA: Diagnosis not present

## 2024-05-01 DIAGNOSIS — S20211A Contusion of right front wall of thorax, initial encounter: Secondary | ICD-10-CM | POA: Diagnosis not present

## 2024-05-01 NOTE — Discharge Instructions (Signed)
 You were seen today for chest wall pain.  Your x-ray does not show any evidence of rib fractures.  Continue ibuprofen as needed for pain or discomfort.  Hoy acudi a biochemist, clinical. La radiografa no muestra evidencia de fracturas costales. Contine tomando ibuprofeno segn sea necesario para el dolor o las molestias.

## 2024-05-01 NOTE — ED Provider Notes (Signed)
  EMERGENCY DEPARTMENT AT [facility] Provider Note   CSN: [id number] Arrival date & time: 04/30/24  2331     Patient presents with: Karen Blankenship is a 89 y.o. female.   HPI     This is an 89 year old female who presents after a fall.  She reportedly fell on Friday.  She describes a mechanical fall where she fell forward hitting the anterior and right side of her chest on a fire pit.  She did not hit her head or lose consciousness.  She is not on any blood thinners.  Her daughter came home tonight and she told her daughter of the fall.  She also subsequently fell in the shower hitting her back.  Again she did not hit her head or lose consciousness.  She is most complaining of right sided chest wall pain when she moves or breathes.  She has taken ibuprofen with some relief.  Patient and family declined medical interpreter.  Daughter provides much of history.  Prior to Admission medications   Medication Sig Start Date End Date Taking? Authorizing Provider  alendronate (FOSAMAX) 70 MG tablet Take 70 mg by mouth once a week. Take with a full glass of water on an empty stomach. Patient not taking: Reported on 04/03/2024    [provider]  aspirin 81 MG chewable tablet Chew 81 mg by mouth daily.    [provider]  atorvastatin (LIPITOR) 10 MG tablet Take 10 mg by mouth daily.    [provider]  Calcium 200 MG TABS     [provider]  cephALEXin (KEFLEX) 500 MG capsule Take 500 mg by mouth in the morning and at bedtime. For 3 days as needed for UTIs    [provider]  cholecalciferol (VITAMIN D3) 25 MCG (1000 UNIT) tablet Take 1,000 Units by mouth daily. Patient not taking: Reported on 04/03/2024    [provider]  ciclopirox  (PENLAC ) 8 % solution Apply topically at bedtime. Apply over nail and surrounding skin. Apply daily over previous coat. After seven (7) days, may remove with alcohol and continue  cycle. Patient not taking: Reported on 04/03/2024 08/04/22   Gershon Donnice SAUNDERS, DPM  estradiol (ESTRACE) 0.1 MG/GM vaginal cream 1/2 gram per vagina twice a week 07/26/20   [provider]  gabapentin (NEURONTIN) 300 MG capsule Take 300 mg by mouth 3 (three) times daily. Patient takes one pill every 8 hours.    [provider]  ibuprofen (ADVIL,MOTRIN) 600 MG tablet Take 600 mg by mouth every 6 (six) hours as needed for pain.    [provider]  ketoconazole  (NIZORAL ) 2 % cream Apply 1 Application topically daily. Patient not taking: Reported on 04/03/2024 08/04/22   Gershon Donnice SAUNDERS, DPM  latanoprost (XALATAN) 0.005 % ophthalmic solution Place 1 drop into both eyes at bedtime.    [provider]  lisinopril -hydrochlorothiazide  (ZESTORETIC ) 20-12.5 MG tablet TAKE 2 TABLETS BY MOUTH ONCE DAILY . APPOINTMENT REQUIRED FOR FUTURE REFILLS 08/24/22   Lavona Agent, MD  metFORMIN (GLUCOPHAGE) 500 MG tablet Take 500 mg by mouth 2 (two) times daily with a meal.    [provider]  omeprazole (PRILOSEC) 20 MG capsule Take 20 mg by mouth daily.    [provider]  Timolol Maleate PF 0.25 % SOLN Apply to eye.    [provider]    Allergies: Quinine derivatives, Sulfa antibiotics, and Tramadol    Review of Systems  Constitutional:  Negative for  fever.  Respiratory:  Negative for shortness of breath.   Cardiovascular:  Positive for chest pain. Negative for leg swelling.  All other systems reviewed and are negative.   Updated Vital Signs BP (!) 126/98 (BP Location: Right Arm)   Pulse (!) 58   Temp 98.1 F (36.7 C) (Oral)   Resp 20   Ht 1.524 m (5')   Wt 72.6 kg   SpO2 100%   BMI 31.25 kg/m   Physical Exam Vitals and nursing note reviewed.  Constitutional:      Appearance: She is well-developed. She is not ill-appearing.  HENT:     Head: Normocephalic and atraumatic.  Eyes:     Pupils: Pupils are equal, round, and reactive to  light.  Cardiovascular:     Rate and Rhythm: Normal rate and regular rhythm.     Heart sounds: Normal heart sounds.  Pulmonary:     Effort: Pulmonary effort is normal. No respiratory distress.     Breath sounds: No wheezing.     Comments: Right sided lower chest wall tenderness to palpation, no overlying, clear breath sounds Chest:     Chest wall: Tenderness present.  Abdominal:     Palpations: Abdomen is soft.     Tenderness: There is no abdominal tenderness. There is no guarding or rebound.  Musculoskeletal:     Cervical back: Neck supple.  Skin:    General: Skin is warm and dry.  Neurological:     Mental Status: She is alert and oriented to person, place, and time.  Psychiatric:        Mood and Affect: Mood normal.     (all labs ordered are listed, but only abnormal results are displayed) Labs Reviewed - No data to display  EKG: None  Radiology: No results found.   Procedures   Medications Ordered in the ED - No data to display                                  Medical Decision Making Amount and/or Complexity of Data Reviewed Radiology: ordered.   This patient presents to the ED for concern of chest discomfort, this involves an extensive number of treatment options, and is a complaint that carries with it a high risk of complications and morbidity.  I considered the following differential and admission for this acute, potentially life threatening condition.  The differential diagnosis includes rib fracture, chest wall contusion, costochondritis, pneumothorax, less likely pneumonia or ACS  MDM:    This is a 89 year old female who presents with right chest pain.  Reports fall on Friday and subsequent pain in the right side of the chest.  It is worse with movement and breathing.  She is overall nontoxic.  Vital signs are reassuring.  EKG shows no evidence of acute ischemia or arrhythmia.  She has reproducible chest wall pain without overlying skin changes or obvious  bruising.  Chest x-ray and ribs do not show any obvious rib fracture.  Patient declined pain medication in the emergency department.  Discussed the results with the patient and her daughter.  They again declined medical interpreter.    (Labs, imaging, consults)  Labs: I Ordered, and personally interpreted labs.  The pertinent results include: None  Imaging Studies ordered: I ordered imaging studies including rib x-ray I independently visualized and interpreted imaging. I agree with the radiologist interpretation  Additional history obtained from chart review.  External records  from outside source obtained and reviewed including prior evaluations  Cardiac Monitoring: The patient was maintained on a cardiac monitor.  If on the cardiac monitor, I personally viewed and interpreted the cardiac monitored which showed an underlying rhythm of: Sinus  Reevaluation: After the interventions noted above, I reevaluated the patient and found that they have :stayed the same  Social Determinants of Health:  lives with daughter  Disposition: Discharge  Co morbidities that complicate the patient evaluation  Past Medical History:  Diagnosis Date   Acid reflux    Acquired polyneuropathy    Aortic atherosclerosis    Aortic root dilation    BMI 30.0-30.9,adult    Chronic insomnia    Diabetic neuropathy (HCC)    DM (diabetes mellitus) (HCC)    Frequent UTI    GERD (gastroesophageal reflux disease)    Glaucoma    Hypercholesterolemia    Hypertension    Hyperthyroidism    Loss of memory    Osteopenia    Osteoporosis    RLS (restless legs syndrome)    Subclavian artery stenosis      Medicines No orders of the defined types were placed in this encounter.   I have reviewed the patients home medicines and have made adjustments as needed  Problem List / ED Course: Problem List Items Addressed This Visit   None Visit Diagnoses       Contusion of right chest wall, initial encounter    -   Primary                Final diagnoses:  None    ED Discharge Orders     None          Charo Philipp, Charmaine FALCON, MD 05/01/24 0147

## 2024-05-01 NOTE — ED Notes (Signed)
 Discharge instructions reviewed.   Opportunity for questions and concerns provided.   Alert, oriented and ambulatory.   Displays no signs of distress.   Encouraged to follow up with PCP for ongoing pain. Please  use ibuprofen as indicated.

## 2024-05-08 DIAGNOSIS — Z23 Encounter for immunization: Secondary | ICD-10-CM | POA: Diagnosis not present

## 2024-06-05 DIAGNOSIS — H401112 Primary open-angle glaucoma, right eye, moderate stage: Secondary | ICD-10-CM | POA: Diagnosis not present

## 2024-06-05 DIAGNOSIS — H401123 Primary open-angle glaucoma, left eye, severe stage: Secondary | ICD-10-CM | POA: Diagnosis not present

## 2024-06-05 DIAGNOSIS — H53453 Other localized visual field defect, bilateral: Secondary | ICD-10-CM | POA: Diagnosis not present

## 2024-06-05 DIAGNOSIS — H47292 Other optic atrophy, left eye: Secondary | ICD-10-CM | POA: Diagnosis not present

## 2025-04-09 ENCOUNTER — Ambulatory Visit: Admitting: Adult Health
